# Patient Record
Sex: Female | Born: 1955 | Race: White | Hispanic: No | Marital: Married | Smoking: Never smoker
Health system: Southern US, Community
[De-identification: ages and names within clinical notes are randomized; demographics above are authoritative.]

## PROBLEM LIST (undated history)

## (undated) DIAGNOSIS — T7840XA Allergy, unspecified, initial encounter: Secondary | ICD-10-CM

## (undated) DIAGNOSIS — E559 Vitamin D deficiency, unspecified: Secondary | ICD-10-CM

## (undated) DIAGNOSIS — R202 Paresthesia of skin: Secondary | ICD-10-CM

## (undated) DIAGNOSIS — N3281 Overactive bladder: Secondary | ICD-10-CM

## (undated) DIAGNOSIS — M79602 Pain in left arm: Secondary | ICD-10-CM

## (undated) DIAGNOSIS — Z9889 Other specified postprocedural states: Secondary | ICD-10-CM

## (undated) DIAGNOSIS — R5383 Other fatigue: Secondary | ICD-10-CM

## (undated) DIAGNOSIS — R0602 Shortness of breath: Secondary | ICD-10-CM

## (undated) HISTORY — DX: Shortness of breath: R06.02

## (undated) HISTORY — PX: APPENDECTOMY: SHX54

## (undated) HISTORY — PX: ABDOMINAL HYSTERECTOMY: SHX81

## (undated) HISTORY — DX: Paresthesia of skin: R20.2

## (undated) HISTORY — DX: Pain in left arm: M79.602

## (undated) HISTORY — DX: Other specified postprocedural states: Z98.890

## (undated) HISTORY — DX: Allergy, unspecified, initial encounter: T78.40XA

## (undated) HISTORY — DX: Other fatigue: R53.83

## (undated) HISTORY — PX: URETHRAL STRICTURE DILATATION: SHX477

---

## 1999-01-10 ENCOUNTER — Other Ambulatory Visit: Admission: RE | Admit: 1999-01-10 | Discharge: 1999-01-10 | Payer: Self-pay | Admitting: Obstetrics & Gynecology

## 1999-12-18 ENCOUNTER — Encounter: Admission: RE | Admit: 1999-12-18 | Discharge: 1999-12-18 | Payer: Self-pay | Admitting: Obstetrics & Gynecology

## 1999-12-18 ENCOUNTER — Encounter: Payer: Self-pay | Admitting: Obstetrics & Gynecology

## 2000-10-08 ENCOUNTER — Ambulatory Visit (HOSPITAL_COMMUNITY): Admission: RE | Admit: 2000-10-08 | Discharge: 2000-10-08 | Payer: Self-pay | Admitting: Obstetrics & Gynecology

## 2000-10-08 ENCOUNTER — Encounter (INDEPENDENT_AMBULATORY_CARE_PROVIDER_SITE_OTHER): Payer: Self-pay

## 2001-01-05 ENCOUNTER — Encounter: Payer: Self-pay | Admitting: Obstetrics & Gynecology

## 2001-01-05 ENCOUNTER — Encounter: Admission: RE | Admit: 2001-01-05 | Discharge: 2001-01-05 | Payer: Self-pay | Admitting: Obstetrics & Gynecology

## 2001-01-12 ENCOUNTER — Other Ambulatory Visit: Admission: RE | Admit: 2001-01-12 | Discharge: 2001-01-12 | Payer: Self-pay | Admitting: Obstetrics & Gynecology

## 2002-01-09 ENCOUNTER — Encounter: Payer: Self-pay | Admitting: Obstetrics & Gynecology

## 2002-01-09 ENCOUNTER — Encounter: Admission: RE | Admit: 2002-01-09 | Discharge: 2002-01-09 | Payer: Self-pay | Admitting: Obstetrics & Gynecology

## 2002-01-17 ENCOUNTER — Other Ambulatory Visit: Admission: RE | Admit: 2002-01-17 | Discharge: 2002-01-17 | Payer: Self-pay | Admitting: Obstetrics & Gynecology

## 2002-01-31 ENCOUNTER — Encounter: Admission: RE | Admit: 2002-01-31 | Discharge: 2002-01-31 | Payer: Self-pay | Admitting: Internal Medicine

## 2002-01-31 ENCOUNTER — Encounter (HOSPITAL_BASED_OUTPATIENT_CLINIC_OR_DEPARTMENT_OTHER): Payer: Self-pay | Admitting: Internal Medicine

## 2003-01-15 ENCOUNTER — Encounter: Admission: RE | Admit: 2003-01-15 | Discharge: 2003-01-15 | Payer: Self-pay | Admitting: Obstetrics & Gynecology

## 2003-01-15 ENCOUNTER — Encounter: Payer: Self-pay | Admitting: Obstetrics & Gynecology

## 2003-01-23 ENCOUNTER — Other Ambulatory Visit: Admission: RE | Admit: 2003-01-23 | Discharge: 2003-01-23 | Payer: Self-pay | Admitting: Obstetrics & Gynecology

## 2004-01-16 ENCOUNTER — Encounter: Admission: RE | Admit: 2004-01-16 | Discharge: 2004-01-16 | Payer: Self-pay | Admitting: Obstetrics & Gynecology

## 2004-01-30 ENCOUNTER — Other Ambulatory Visit: Admission: RE | Admit: 2004-01-30 | Discharge: 2004-01-30 | Payer: Self-pay | Admitting: Obstetrics & Gynecology

## 2005-01-16 ENCOUNTER — Encounter: Admission: RE | Admit: 2005-01-16 | Discharge: 2005-01-16 | Payer: Self-pay | Admitting: Obstetrics & Gynecology

## 2005-01-28 ENCOUNTER — Encounter: Admission: RE | Admit: 2005-01-28 | Discharge: 2005-01-28 | Payer: Self-pay | Admitting: Obstetrics & Gynecology

## 2005-02-02 ENCOUNTER — Other Ambulatory Visit: Admission: RE | Admit: 2005-02-02 | Discharge: 2005-02-02 | Payer: Self-pay | Admitting: Obstetrics & Gynecology

## 2006-02-05 ENCOUNTER — Encounter: Admission: RE | Admit: 2006-02-05 | Discharge: 2006-02-05 | Payer: Self-pay | Admitting: Obstetrics & Gynecology

## 2006-07-30 ENCOUNTER — Encounter (INDEPENDENT_AMBULATORY_CARE_PROVIDER_SITE_OTHER): Payer: Self-pay | Admitting: *Deleted

## 2006-07-30 ENCOUNTER — Ambulatory Visit (HOSPITAL_BASED_OUTPATIENT_CLINIC_OR_DEPARTMENT_OTHER): Admission: RE | Admit: 2006-07-30 | Discharge: 2006-07-30 | Payer: Self-pay | Admitting: Orthopedic Surgery

## 2007-02-08 ENCOUNTER — Encounter: Admission: RE | Admit: 2007-02-08 | Discharge: 2007-02-08 | Payer: Self-pay | Admitting: Obstetrics & Gynecology

## 2007-10-07 ENCOUNTER — Emergency Department (HOSPITAL_COMMUNITY): Admission: EM | Admit: 2007-10-07 | Discharge: 2007-10-07 | Payer: Self-pay | Admitting: Emergency Medicine

## 2007-11-11 ENCOUNTER — Emergency Department (HOSPITAL_COMMUNITY): Admission: EM | Admit: 2007-11-11 | Discharge: 2007-11-11 | Payer: Self-pay | Admitting: Family Medicine

## 2008-02-13 ENCOUNTER — Encounter: Admission: RE | Admit: 2008-02-13 | Discharge: 2008-02-13 | Payer: Self-pay | Admitting: Obstetrics & Gynecology

## 2009-02-13 ENCOUNTER — Encounter: Admission: RE | Admit: 2009-02-13 | Discharge: 2009-02-13 | Payer: Self-pay | Admitting: Obstetrics & Gynecology

## 2009-04-18 ENCOUNTER — Ambulatory Visit: Payer: Self-pay | Admitting: Family Medicine

## 2009-04-18 DIAGNOSIS — R5381 Other malaise: Secondary | ICD-10-CM | POA: Insufficient documentation

## 2009-04-18 DIAGNOSIS — R5383 Other fatigue: Secondary | ICD-10-CM

## 2009-04-18 DIAGNOSIS — M949 Disorder of cartilage, unspecified: Secondary | ICD-10-CM

## 2009-04-18 DIAGNOSIS — J309 Allergic rhinitis, unspecified: Secondary | ICD-10-CM | POA: Insufficient documentation

## 2009-04-18 DIAGNOSIS — M899 Disorder of bone, unspecified: Secondary | ICD-10-CM | POA: Insufficient documentation

## 2009-04-18 DIAGNOSIS — J45909 Unspecified asthma, uncomplicated: Secondary | ICD-10-CM | POA: Insufficient documentation

## 2009-04-19 LAB — CONVERTED CEMR LAB
ALT: 20 units/L (ref 0–35)
Alkaline Phosphatase: 63 units/L (ref 39–117)
Basophils Relative: 0.5 % (ref 0.0–3.0)
Bilirubin, Direct: 0.1 mg/dL (ref 0.0–0.3)
Chloride: 109 meq/L (ref 96–112)
Creatinine, Ser: 0.7 mg/dL (ref 0.4–1.2)
Folate: 7.6 ng/mL
HCT: 34.7 % — ABNORMAL LOW (ref 36.0–46.0)
Hemoglobin: 12.4 g/dL (ref 12.0–15.0)
LDL Cholesterol: 99 mg/dL (ref 0–99)
Lymphocytes Relative: 37.3 % (ref 12.0–46.0)
MCHC: 35.6 g/dL (ref 30.0–36.0)
Monocytes Relative: 5.6 % (ref 3.0–12.0)
Neutro Abs: 3.6 10*3/uL (ref 1.4–7.7)
RBC: 3.9 M/uL (ref 3.87–5.11)
Sodium: 143 meq/L (ref 135–145)
TSH: 0.86 microintl units/mL (ref 0.35–5.50)
Total Bilirubin: 0.9 mg/dL (ref 0.3–1.2)
Total CHOL/HDL Ratio: 3
Triglycerides: 80 mg/dL (ref 0.0–149.0)

## 2009-06-04 ENCOUNTER — Encounter: Payer: Self-pay | Admitting: Family Medicine

## 2009-06-04 ENCOUNTER — Ambulatory Visit: Payer: Self-pay | Admitting: Internal Medicine

## 2009-06-05 ENCOUNTER — Ambulatory Visit: Payer: Self-pay | Admitting: Family Medicine

## 2009-06-05 DIAGNOSIS — S139XXA Sprain of joints and ligaments of unspecified parts of neck, initial encounter: Secondary | ICD-10-CM | POA: Insufficient documentation

## 2009-06-05 DIAGNOSIS — M546 Pain in thoracic spine: Secondary | ICD-10-CM | POA: Insufficient documentation

## 2009-06-11 ENCOUNTER — Telehealth: Payer: Self-pay | Admitting: Family Medicine

## 2009-06-12 ENCOUNTER — Ambulatory Visit: Payer: Self-pay | Admitting: Family Medicine

## 2009-07-17 ENCOUNTER — Ambulatory Visit: Payer: Self-pay | Admitting: Family Medicine

## 2009-07-29 LAB — CONVERTED CEMR LAB: Vit D, 25-Hydroxy: 50 ng/mL (ref 30–89)

## 2009-10-14 ENCOUNTER — Telehealth: Payer: Self-pay | Admitting: Family Medicine

## 2009-12-06 ENCOUNTER — Encounter: Payer: Self-pay | Admitting: Family Medicine

## 2010-02-14 ENCOUNTER — Encounter: Admission: RE | Admit: 2010-02-14 | Discharge: 2010-02-14 | Payer: Self-pay | Admitting: Obstetrics & Gynecology

## 2010-02-17 LAB — HM MAMMOGRAPHY: HM Mammogram: NORMAL

## 2010-02-27 LAB — CONVERTED CEMR LAB

## 2010-02-27 LAB — HM PAP SMEAR

## 2010-03-14 ENCOUNTER — Ambulatory Visit: Payer: Self-pay | Admitting: Family Medicine

## 2010-03-14 DIAGNOSIS — E041 Nontoxic single thyroid nodule: Secondary | ICD-10-CM

## 2010-03-18 LAB — CONVERTED CEMR LAB
T3, Free: 3 pg/mL (ref 2.3–4.2)
TSH: 1.1 microintl units/mL (ref 0.35–5.50)

## 2010-03-21 ENCOUNTER — Ambulatory Visit: Payer: Self-pay | Admitting: Family Medicine

## 2010-03-24 LAB — CONVERTED CEMR LAB
AST: 22 units/L (ref 0–37)
BUN: 12 mg/dL (ref 6–23)
Basophils Absolute: 0 10*3/uL (ref 0.0–0.1)
Eosinophils Absolute: 0.1 10*3/uL (ref 0.0–0.7)
GFR calc non Af Amer: 96.3 mL/min (ref >60)
Lymphocytes Relative: 38.7 % (ref 12.0–46.0)
MCHC: 33.8 g/dL (ref 30.0–36.0)
Neutrophils Relative %: 55.4 % (ref 43.0–77.0)
Platelets: 295 10*3/uL (ref 150.0–400.0)
Potassium: 4.3 meq/L (ref 3.5–5.1)
RDW: 12.8 % (ref 11.5–14.6)
Sodium: 143 meq/L (ref 135–145)
Total Bilirubin: 0.5 mg/dL (ref 0.3–1.2)
Vit D, 25-Hydroxy: 38 ng/mL (ref 30–89)

## 2010-04-07 ENCOUNTER — Encounter: Payer: Self-pay | Admitting: Family Medicine

## 2010-04-14 ENCOUNTER — Telehealth: Payer: Self-pay | Admitting: Family Medicine

## 2010-06-17 ENCOUNTER — Ambulatory Visit: Payer: Self-pay | Admitting: Internal Medicine

## 2010-06-17 DIAGNOSIS — H109 Unspecified conjunctivitis: Secondary | ICD-10-CM | POA: Insufficient documentation

## 2010-10-05 ENCOUNTER — Encounter: Payer: Self-pay | Admitting: Obstetrics & Gynecology

## 2010-10-14 NOTE — Assessment & Plan Note (Signed)
Summary: CHECK THYROID/CLE   Vital Signs:  Patient profile:   55 year old female Height:      65 inches Weight:      144.0 pounds BMI:     24.05 Temp:     99.2 degrees F oral Pulse rate:   68 / minute Pulse rhythm:   regular BP sitting:   110 / 70  (left arm) Cuff size:   regular  Vitals Entered By: Benny Lennert CMA Duncan Dull) (March 14, 2010 8:36 AM)  History of Present Illness: Chief complaint check thyriofd.   Feels neck swollen appearing.  Skin very dry, hair diffusely falling out. Increase in fatigue...over last 6 months. Cannot lose weight. Occ dizziness.   Works as Midwife. Last 11/2008.Marland Kitchenwhen carotid doppler done by her coworkers thyroid lesion on left seen .Marland Kitchen...benign in appearance. .biopsied by Dr. Wyn Quaker. Since then a work having every 6 months...last one showed ? new lesions on thyroid. Hears heart beat in ears.  Problems Prior to Update: 1)  Uri  (ICD-465.9) 2)  Back Pain  (ICD-724.5) 3)  Back Pain, Thoracic Region  (ICD-724.1) 4)  Cervical Strain, Acute  (ICD-847.0) 5)  Neck Mass  (ICD-784.2) 6)  Screening, Colon Cancer  (ICD-V76.51) 7)  Fatigue  (ICD-780.79) 8)  Screening For Lipoid Disorders  (ICD-V77.91) 9)  Osteopenia  (ICD-733.90) 10)  Allergic Rhinitis  (ICD-477.9) 11)  Asthma, Intermittent, Mild  (ICD-493.90)  Current Medications (verified): 1)  Estradiol 2 Mg Tabs (Estradiol) .... Take 1 Tablet By Mouth Once A Day 2)  Otc Sinus Meds .... As Needed 3)  Caltrate 600+d Plus 600-400 Mg-Unit Tabs (Calcium Carbonate-Vit D-Min) .... Take 1 Tablet By Mouth Every 12 Hours 4)  Vitamin D (Ergocalciferol) 50000 Unit Caps (Ergocalciferol) .Marland Kitchen.. 1 Tab By Mouth Weekly X 12 Weeks 5)  B-12 Vitamin 6)  Dha 450 Mg .... Take One Tablet Daily 7)  Vagifem 10 Mcg Tabs (Estradiol) .... Use As Directed By Gyn  Allergies: 1)  ! Penicillin 2)  ! Erythromycin 3)  ! Clindamycin 4)  ! Codeine 5)  ! * Bananas 6)  ! * Bee Stings  Past History:  Past medical,  surgical, family and social histories (including risk factors) reviewed, and no changes noted (except as noted below).  Past Medical History: Reviewed history from 04/18/2009 and no changes required. Allergic rhinitis   GYN: Dr. Jennette Kettle  Past Surgical History: Reviewed history from 04/18/2009 and no changes required. 12/2008 nonhemmorhagic solid mass, left neck: followed by Dr. Willeen Cass, ENT bx noncancerous Plans to repeat US...10 /2010 as a child ? urethral stretch for frequent UTI.  Appendectomy Hysterectomy, partial initialy, then BSO later: endometriosis, cysts and fibroids: no cancer seen...has cervix  Family History: Reviewed history from 04/18/2009 and no changes required. father: died age 19 for bladder cancer mother:COPD, arrythmia siblings: brother: CAD, heart issues...recovering crack addict, bipolar sister: allergies PGM: kidney issues  Social History: Reviewed history from 04/18/2009 and no changes required. Occupation:Vascular Lab Coordinator, in CNA school Married no children Never Smoked Alcohol use-1-2 times a week Drug use-no Regular exercise-no Diet: moderate, some fruit and veggies, chicken, fish, limited fried foods, limited calcium intake  Review of Systems General:  Complains of fatigue; denies fever and weight loss. CV:  Denies chest pain or discomfort. Resp:  Denies shortness of breath, sputum productive, and wheezing. GI:  Denies abdominal pain. GU:  Denies dysuria.  Physical Exam  General:  Well-developed,well-nourished,in no acute distress; alert,appropriate and cooperative throughout examination Ears:  External ear  exam shows no significant lesions or deformities.  Otoscopic examination reveals clear canals, tympanic membranes are intact bilaterally without bulging, retraction, inflammation or discharge. Hearing is grossly normal bilaterally. Nose:  External nasal examination shows no deformity or inflammation. Nasal mucosa are pink and moist  without lesions or exudates. Mouth:  Oral mucosa and oropharynx without lesions or exudates.  Teeth in good repair. Neck:  diffusely enlarged thyroid.Marland Kitchenno palpable masses Lungs:  Normal respiratory effort, chest expands symmetrically. Lungs are clear to auscultation, no crackles or wheezes. Heart:  Normal rate and regular rhythm. S1 and S2 normal without gallop, murmur, click, rub or other extra sounds. Pulses:  R and L posterior tibial pulses are full and equal bilaterally  Extremities:  no edema   Impression & Recommendations:  Problem # 1:  FATIGUE (ICD-780.79) Eval for developing  thyroid issues. Previous fatigue eval neg.  Orders: TLB-TSH (Thyroid Stimulating Hormone) (84443-TSH) TLB-T3, Free (Triiodothyronine) (84481-T3FREE) TLB-T4 (Thyrox), Free [phone])  Problem # 2:  THYROID NODULE (ICD-241.0) Per pt new appearance of nodules on right?  Get [place] report. MAy need referral to endo.   Orders: TLB-TSH (Thyroid Stimulating Hormone) (84443-TSH)  Complete Medication List: 1)  Estradiol 2 Mg Tabs (Estradiol) .... Take 1 tablet by mouth once a day 2)  Otc Sinus Meds  .... As needed 3)  Caltrate 600+d Plus 600-400 Mg-unit Tabs (Calcium carbonate-vit d-min) .... Take 1 tablet by mouth every 12 hours 4)  Vitamin D (ergocalciferol) 50000 Unit Caps (Ergocalciferol) .Marland Kitchen.. 1 tab by mouth weekly x 12 weeks 5)  B-12 Vitamin  6)  Dha 450 Mg  .... Take one tablet daily 7)  Vagifem 10 Mcg Tabs (Estradiol) .... Use as directed by gyn  Patient Instructions: 1)  Please send thyroid US results to our office. 2)  Call when ready to schedule colonoscopy.  3)  Fasting lipids, CMET after 04/2010 Dx Dx 77.91  Current Allergies (reviewed today): ! PENICILLIN ! ERYTHROMYCIN ! CLINDAMYCIN ! CODEINE ! * BANANAS ! * BEE STINGS  Last PAP:  normal (02/12/2009 8:42:50 AM) PAP Result Date:  02/27/2010 PAP Result:  low estrogen per Dr. Jennette Kettle PAP Next Due:  1 yr Last Mammogram:  normal (02/12/2009  8:43:20 AM) Mammogram Result Date:  02/17/2010 Mammogram Result:  normal Mammogram Next Due:  1 yr one density.Marland Kitchenosteopenia improving 02/2010

## 2010-10-14 NOTE — Progress Notes (Signed)
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 It is the only med that has ever helped her neck pain. Pt uses [facility].  Pt knows Dr. Ermalene Searing will be back tomorrow. Please advise.  Initial call taken by: Lewanda Rife LPN,  October 14, 2009 4:51 PM  Follow-up for Phone Call        Phone Call Completed patient advised.Consuello Masse CMA  Follow-up by: Benny Lennert CMA (AAMA),  October 15, 2009 10:13 AM    New/Updated Medications: CELEBREX 200 MG CAPS (CELECOXIB) 1 tab by mouth daily Prescriptions: CELEBREX 200 MG CAPS (CELECOXIB) 1 tab by mouth daily  #30 x 3   Entered and Authorized by:   Theresa Nora MD   Signed by:   Theresa Nora MD on 10/15/2009   Method used:   Electronically to        [facility] [address] [phone]* (retail)       [address] [place]  [address]       Ph: [phone]       Fax: [phone]   RxID:   [id number]

## 2010-10-14 NOTE — Consult Note (Signed)
Summary: [facility] Endocrinology  [facility] Endocrinology   Imported By: Lanelle Bal 04/14/2010 10:30:37  _____________________________________________________________________  External Attachment:    Type:   Image     Comment:   External Document

## 2010-10-14 NOTE — Assessment & Plan Note (Signed)
Summary: ??lyme disease   Vital Signs:  Patient profile:   55 year old female Weight:      149 pounds Temp:     98.2 degrees F oral Pulse rate:   68 / minute Pulse rhythm:   regular BP sitting:   108 / 70  (left arm) Cuff size:   regular  Vitals Entered By: Selena Batten Dance CMA Duncan Dull) (June 17, 2010 3:55 PM) CC: ? Lymes Disease   History of Present Illness: ZO:XWRU?  works at [employer] and [employer].  04/2010 bit by tick, PA there looked at it, pt had target lesion around bite and had eye sensitivity to light.  Given 2 wks of doxycycline for presumed lyme disease and sxs went away.    Sxs reappeared 2 days ago.  Eye pain - L > R, tearing easily, + draining.  + mild matting in AM L>R.  + photophobia.  L eye more red than right eye.  + HA  h/o allergies, recently colored hair sunday, sxs started afterwards.  had exposure to passive smoke all weekend  No recent travel outside of state.  No joint pains, muscle pains, fevers, abd pain, confusion.  no new rashes/tick bites  uses contacts.  Allergies: 1)  ! Penicillin 2)  ! Erythromycin 3)  ! Clindamycin 4)  ! Codeine 5)  ! * Bananas 6)  ! * Bee Stings  Past History:  Past Medical History: Last updated: 04/18/2009 Allergic rhinitis   GYN: Dr. Jennette Kettle  Social History: Last updated: 04/18/2009 Occupation:Vascular Lab Coordinator, in CNA school Married no children Never Smoked Alcohol use-1-2 times a week Drug use-no Regular exercise-no Diet: moderate, some fruit and veggies, chicken, fish, limited fried foods, limited calcium intake  Review of Systems       per HPI  Physical Exam  General:  Well-developed,well-nourished,in no acute distress; alert,appropriate and cooperative throughout examination Head:  Normocephalic and atraumatic without obvious abnormalities. No apparent alopecia or balding. Eyes:  + L > R conjunctival injection with limbic sparing.  EOMI wihtout pain, PERRL, slight swelling superior L  eyelid Ears:  External ear exam shows no significant lesions or deformities.  Otoscopic examination reveals clear canals, tympanic membranes are intact bilaterally without bulging, retraction, inflammation or discharge. Hearing is grossly normal bilaterally. Nose:  External nasal examination shows no deformity or inflammation. Nasal mucosa are pink and moist without lesions or exudates. Mouth:  Oral mucosa and oropharynx without lesions or exudates.  Teeth in good repair. Lungs:  Normal respiratory effort, chest expands symmetrically. Lungs are clear to auscultation, no crackles or wheezes. Heart:  Normal rate and regular rhythm. S1 and S2 normal without gallop, murmur, click, rub or other extra sounds.   Impression & Recommendations:  Problem # 1:  UNSPECIFIED CONJUNCTIVITIS (ICD-372.30)  ? irritant conjunctivitis,  recommend stop contacts for 1 1/2 wks.  treat with refresh tears and patanol eye drops. RTC if not improving, or call eye doctor to eval.  Complete Medication List: 1)  Estradiol 2 Mg Tabs (Estradiol) .... Take 1 tablet by mouth once a day 2)  Otc Sinus Meds  .... As needed 3)  Caltrate 600+d Plus 600-400 Mg-unit Tabs (Calcium carbonate-vit d-min) .... Take 1 tablet by mouth every 12 hours 4)  Vitamin D (ergocalciferol) 50000 Unit Caps (Ergocalciferol) .Marland Kitchen.. 1 tab by mouth weekly x 12 weeks 5)  B-12 Vitamin  6)  Dha 450 Mg  .... Take one tablet daily 7)  Vagifem 10 Mcg Tabs (Estradiol) .... Use  as directed by gyn 8)  Epipen 2-pak 0.3 Mg/0.25ml Devi (Epinephrine) .... Use as directed for ananphylaxis 9)  Patanol 0.1 % Soln (Olopatadine hcl) .... One drop into each eye twice daily  Patient Instructions: 1)  This could be irritant conjunctivitis or irritation to eye.  Treat with patanol twice daily for 1 week.  if not noticing improvement with this, please return or call Dr. Hyacinth Meeker to be checked out. 2)  warm compresses to eye twice daily. Prescriptions: PATANOL 0.1 % SOLN  (OLOPATADINE HCL) one drop into each eye twice daily  #1 x 0   Entered and Authorized by:   Eustaquio Boyden  MD   Signed by:   Eustaquio Boyden  MD on 06/17/2010   Method used:   Print then Give to Patient   RxID:   [id number]   Current Allergies (reviewed today): ! PENICILLIN ! ERYTHROMYCIN ! CLINDAMYCIN ! CODEINE ! * BANANAS ! * BEE STINGS

## 2010-10-14 NOTE — Progress Notes (Signed)
Summary: asking foe epi pen   Phone Note Call from Patient Call back at Home Phone [phone] Call back at [phone]   Caller: Patient Call For: Kerby Nora MD Summary of Call: Patient says that she is allergic to bees and her epipen is out of date. She is asking if she could get a rx sent to [facility] [address] in [place].  Initial call taken by: Melody Comas,  April 14, 2010 2:47 PM    New/Updated Medications: EPIPEN 2-PAK 0.3 MG/0.3ML DEVI (EPINEPHRINE) Use as directed for ananphylaxis Prescriptions: EPIPEN 2-PAK 0.3 MG/0.3ML DEVI (EPINEPHRINE) Use as directed for ananphylaxis  #1 x 0   Entered and Authorized by:   Kerby Nora MD   Signed by:   Kerby Nora MD on 04/14/2010   Method used:   Electronically to        [facility] [address] [phone]* (retail)       [address] [place]  [address]       Ph: [phone]       Fax: [phone]   RxID:   [id number]

## 2010-10-15 ENCOUNTER — Telehealth: Payer: Self-pay | Admitting: Family Medicine

## 2010-10-17 ENCOUNTER — Telehealth: Payer: Self-pay | Admitting: Family Medicine

## 2010-10-22 NOTE — Progress Notes (Signed)
Summary: prior auth needed for celebrex  Phone Note From Pharmacy   Caller: [facility] [address] [employer] of Call: Prior Berkley Harvey is needed for celebrex, form is on your desk. Initial call taken by: Lowella Petties CMA, AAMA,  October 17, 2010 10:51 AM

## 2010-10-22 NOTE — Progress Notes (Signed)
Summary: refill request for celebrex  Phone Note Refill Request Message from:  Fax from Pharmacy  Refills Requested: Medication #1:  celebrex 200 mg   Last Refilled: 11/28/2009 Faxed request from [facility] [address], this is no longer on med list.  Initial call taken by: Lowella Petties CMA, AAMA,  October 15, 2010 12:20 PM    New/Updated Medications: CELEBREX 200 MG CAPS (CELECOXIB) Take 1 tablet by mouth once a day Prescriptions: CELEBREX 200 MG CAPS (CELECOXIB) Take 1 tablet by mouth once a day  #30 x 0   Entered and Authorized by:   Kerby Nora MD   Signed by:   Kerby Nora MD on 10/15/2010   Method used:   Electronically to        [facility] [address]* (retail)       [address]       [place], [place]  [address]       Ph: [phone]       Fax: [phone]   RxID:   [id number]

## 2010-12-09 ENCOUNTER — Other Ambulatory Visit: Payer: Self-pay | Admitting: Obstetrics & Gynecology

## 2010-12-09 DIAGNOSIS — Z1231 Encounter for screening mammogram for malignant neoplasm of breast: Secondary | ICD-10-CM

## 2011-01-13 ENCOUNTER — Encounter: Payer: Self-pay | Admitting: *Deleted

## 2011-01-14 ENCOUNTER — Encounter: Payer: Self-pay | Admitting: Family Medicine

## 2011-01-14 ENCOUNTER — Ambulatory Visit (INDEPENDENT_AMBULATORY_CARE_PROVIDER_SITE_OTHER): Payer: BC Managed Care – PPO | Admitting: Family Medicine

## 2011-01-14 DIAGNOSIS — R079 Chest pain, unspecified: Secondary | ICD-10-CM | POA: Insufficient documentation

## 2011-01-14 NOTE — Patient Instructions (Signed)
REFERRAL: GO THE THE FRONT ROOM AT THE ENTRANCE OF OUR CLINIC, NEAR CHECK IN. ASK FOR MARION. SHE WILL HELP YOU SET UP YOUR REFERRAL. DATE: TIME:  

## 2011-01-14 NOTE — Progress Notes (Signed)
55 year old female minimal cardiac risk factors, presents with a week of chest pain:  Has been having chest pain, stabbing and shooting. Left, sternal.  Will come and go, may be ten to fifteeen seconds - sometimes upwards of 5-10 mins, may be on and off in the afternoon. No regularity.  No chest pain right now.  This morning, had some pain in the epigastric region.  Left arm felt a little bit heavy, also has a headset and felt like a weight on her shoulder. Known history of neck disease.  Also has some neck pain in the past, and her left arm felt heavy. And has been really tired. Also has a history of Lyme disease.   FH: No smoker. No drugs.  Mom has LBBB and has a pacemaker. No other cardiac issues.  Not clear about grandparents.   Patient Active Problem List  Diagnoses  . THYROID NODULE  . ALLERGIC RHINITIS  . ASTHMA, INTERMITTENT, MILD  . OSTEOPENIA  . FATIGUE  . Chest pain   Past Medical History  Diagnosis Date  . Allergy    Past Surgical History  Procedure Date  . Urethral stricture dilatation     when child  . Appendectomy   . Abdominal hysterectomy     has cervix   History  Substance Use Topics  . Smoking status: Never Smoker   . Smokeless tobacco: Not on file  . Alcohol Use: No   Family History  Problem Relation Age of Onset  . COPD Mother   . Heart disease Mother     arrythmia  . Cancer Father     bladder cancer  . Allergies Sister   . Heart disease Brother   . Drug abuse Brother     recovering crack addict, bipolar  . Kidney disease Paternal Grandmother    Allergies  Allergen Reactions  . Clindamycin     REACTION: Rash, SOB  . Codeine     REACTION: N \\T \ V  . Erythromycin     REACTION: Rash, SOB  . Penicillins     REACTION: As a child   Current Outpatient Prescriptions on File Prior to Visit  Medication Sig Dispense Refill  . Calcium Carbonate-Vit D-Min (CALTRATE 600+D PLUS) 600-400 MG-UNIT per tablet Take 1 tablet by mouth 2 (two) times  daily.        . celecoxib (CELEBREX) 200 MG capsule Take 200 mg by mouth daily.        . Cyanocobalamin (VITAMIN B12 PO) Take 1 tablet by mouth daily.        Marland Kitchen EPINEPHrine (EPI-PEN) 0.3 mg/0.3 mL DEVI Inject 0.3 mg into the muscle once.        Marland Kitchen estradiol (ESTRACE) 2 MG tablet Take 2 mg by mouth daily.        . Estradiol (VAGIFEM) 10 MCG TABS Place vaginally as directed.        Marland Kitchen olopatadine (PATANOL) 0.1 % ophthalmic solution Place 1 drop into both eyes 2 (two) times daily.        . Prenat-FeBis-FePro-FA-CA-Omega (DUET DHA PO) Take 450 mg by mouth daily.        . Vitamin D, Ergocalciferol, (DRISDOL) 50000 UNITS CAPS Take 50,000 Units by mouth every 7 (seven) days.        Marland Kitchen DISCONTD: chlorpheniramine-pseudoephedrine-acetaminophen (SINUTAB) 2-30-500 MG per tablet Take 1 tablet by mouth as needed.         ROS: GEN: No acute illnesses, no fevers, chills. GI: No n/v/d, eating  normally Pulm / CV above Interactive and getting along well at home.  Otherwise, ROS is as per the HPI.  GEN: WDWN, NAD, Non-toxic, A & O x 3 HEENT: Atraumatic, Normocephalic. Neck supple. No masses, No LAD. Ears and Nose: No external deformity. CV: RRR, No M/G/R. No JVD. No thrill. No extra heart sounds. PULM: CTA B, no wheezes, crackles, rhonchi. No retractions. No resp. distress. No accessory muscle use. ABD: S, NT, ND, +BS. No rebound tenderness. No HSM.  EXTR: No c/c/e NEURO Normal gait.  PSYCH: Normally interactive. Conversant. Not depressed or anxious appearing.  Calm demeanor.   A/P: Chest pain, atypical. Minimal cardiac risk factors, chest pain with abnormal EKG.  EKG: Normal sinus rhythm. Normal axis, normal R wave progression, No acute ST elevation or depression. Several leads with non-specific T wave changes.  In a 55 yo, could be an ischemic event. We have asked [facility] to assist and consult. Appreciate assistance.

## 2011-01-21 ENCOUNTER — Ambulatory Visit (INDEPENDENT_AMBULATORY_CARE_PROVIDER_SITE_OTHER): Payer: BC Managed Care – PPO | Admitting: Cardiology

## 2011-01-21 ENCOUNTER — Encounter: Payer: Self-pay | Admitting: Cardiology

## 2011-01-21 DIAGNOSIS — R5383 Other fatigue: Secondary | ICD-10-CM

## 2011-01-21 DIAGNOSIS — R202 Paresthesia of skin: Secondary | ICD-10-CM | POA: Insufficient documentation

## 2011-01-21 DIAGNOSIS — R079 Chest pain, unspecified: Secondary | ICD-10-CM

## 2011-01-21 DIAGNOSIS — M79602 Pain in left arm: Secondary | ICD-10-CM | POA: Insufficient documentation

## 2011-01-21 DIAGNOSIS — R5381 Other malaise: Secondary | ICD-10-CM

## 2011-01-21 DIAGNOSIS — R0602 Shortness of breath: Secondary | ICD-10-CM | POA: Insufficient documentation

## 2011-01-21 NOTE — Progress Notes (Signed)
Kathe Becton Date of Birth: 1956/04/06   History of Present Illness: Dreana is seen at the request of Dr. Dallas Schimke for evaluation of chest pain. She states she has been experiencing symptoms of chest pain off and on. She describes this as a stabbing sensation on both sides of her left breast. On one occasion she also experienced some left arm pain described as a heaviness. She has had some tingling in her left hand fingers. Her major complaint is that she has been severely fatigued. She has no known history of cardiac disease. She has no significant cardiac risk factors.  Current Outpatient Prescriptions on File Prior to Visit  Medication Sig Dispense Refill  . aspirin 81 MG tablet Take 81 mg by mouth daily.        Marland Kitchen EPINEPHrine (EPI-PEN) 0.3 mg/0.3 mL DEVI Inject 0.3 mg into the muscle as needed.       Marland Kitchen estradiol (ESTRACE) 2 MG tablet Take 2 mg by mouth daily.        Marland Kitchen DISCONTD: Calcium Carbonate-Vit D-Min (CALTRATE 600+D PLUS) 600-400 MG-UNIT per tablet Take 1 tablet by mouth 2 (two) times daily.        Marland Kitchen DISCONTD: celecoxib (CELEBREX) 200 MG capsule Take 200 mg by mouth daily.        Marland Kitchen DISCONTD: Cyanocobalamin (VITAMIN B12 PO) Take 1 tablet by mouth daily.        Marland Kitchen DISCONTD: Estradiol (VAGIFEM) 10 MCG TABS Place vaginally as directed.        Marland Kitchen DISCONTD: olopatadine (PATANOL) 0.1 % ophthalmic solution Place 1 drop into both eyes 2 (two) times daily.       Marland Kitchen DISCONTD: Prenat-FeBis-FePro-FA-CA-Omega (DUET DHA PO) Take 450 mg by mouth daily.        Marland Kitchen DISCONTD: Vitamin D, Ergocalciferol, (DRISDOL) 50000 UNITS CAPS Take 50,000 Units by mouth every 7 (seven) days.          Allergies  Allergen Reactions  . Clindamycin     REACTION: Rash, SOB  . Codeine     REACTION: N \\T \ V  . Erythromycin     REACTION: Rash, SOB  . Other     Bee stings, banana  . Penicillins     REACTION: As a child    Past Medical History  Diagnosis Date  . Allergy   . Asthma   . Chest pain   . Fatigue     . Arm pain, left   . Tingling     in fingers  . SOB (shortness of breath)     occassionally    Past Surgical History  Procedure Date  . Urethral stricture dilatation     when child  . Appendectomy   . Abdominal hysterectomy     has cervix    History  Smoking status  . Never Smoker   Smokeless tobacco  . Not on file    History  Alcohol Use No    Family History  Problem Relation Age of Onset  . COPD Mother   . Heart disease Mother     arrythmia  . Colon cancer Mother   . Cancer Father     bladder cancer  . Allergies Sister   . Heart disease Brother   . Drug abuse Brother     recovering crack addict, bipolar  . Kidney disease Paternal Grandmother   . Kidney disease Maternal Grandmother   . Heart disease Paternal Grandfather   . Alcohol abuse Paternal Grandfather  Review of Systems: The review of systems is positive for marked fatigue. She apparently was diagnosed with mild disease last year and was treated. She is currently working 2 jobs but doesn't find this particularly stressful. All other systems were reviewed and are negative.  Physical Exam: BP 110/80  Pulse 60  Ht 5\' 5"  (1.651 m)  Wt 144 lb 9.6 oz (65.59 kg)  BMI 24.06 kg/m2 She is a pleasant white female in no acute distress. Her she is normocephalic, atraumatic. Pupils are equal round and reactive to light and accommodation. Extraocular movements are full. Oropharynx is clear. Neck is supple no JVD, adenopathy, thyromegaly, or bruits. Lungs are clear to auscultation and percussion. Cardiac exam reveals a regular rate and rhythm without gallop, murmur, or click. Abdomen is soft and nontender. She has no masses or bruits. Femoral and pedal pulses are 2+ and symmetric. She has no edema. Skin is warm and dry. She is alert and oriented x3. Cranial nerves II through XII are intact. LABORATORY DATA: ECG demonstrates normal sinus rhythm there is T-wave inversion in leads V1 through V3.  Assessment /  Plan:

## 2011-01-21 NOTE — Assessment & Plan Note (Signed)
Her symptoms of chest pain are atypical. She has no significant cardiac risk factors. She does have mild T-wave inversion on her ECG. I recommended further cardiac evaluation with a stress echo. If this is normal we can reassure her from a cardiac standpoint.

## 2011-01-21 NOTE — Patient Instructions (Signed)
We will schedule you for a stress Echo.  If this is OK this would reassure [place] that your heart is good.

## 2011-01-21 NOTE — Assessment & Plan Note (Signed)
We will proceed with her stress test evaluation. If this is unremarkable she may need more complete laboratory evaluation to rule out anemia or thyroid disease.

## 2011-01-22 ENCOUNTER — Telehealth: Payer: Self-pay | Admitting: Cardiology

## 2011-01-22 NOTE — Telephone Encounter (Signed)
Called wanting EKG faxed to her. Faxed.

## 2011-01-22 NOTE — Telephone Encounter (Signed)
Wants a copy of ekg- phone [phone] ext [phone]   Fax [phone]

## 2011-01-30 ENCOUNTER — Ambulatory Visit (HOSPITAL_BASED_OUTPATIENT_CLINIC_OR_DEPARTMENT_OTHER): Payer: BC Managed Care – PPO | Admitting: Radiology

## 2011-01-30 ENCOUNTER — Ambulatory Visit (HOSPITAL_COMMUNITY): Payer: BC Managed Care – PPO | Attending: Cardiology | Admitting: Radiology

## 2011-01-30 ENCOUNTER — Other Ambulatory Visit (HOSPITAL_COMMUNITY): Payer: BC Managed Care – PPO | Admitting: Radiology

## 2011-01-30 DIAGNOSIS — R072 Precordial pain: Secondary | ICD-10-CM

## 2011-01-30 DIAGNOSIS — R0989 Other specified symptoms and signs involving the circulatory and respiratory systems: Secondary | ICD-10-CM | POA: Insufficient documentation

## 2011-01-30 DIAGNOSIS — R5381 Other malaise: Secondary | ICD-10-CM | POA: Insufficient documentation

## 2011-01-30 DIAGNOSIS — R0609 Other forms of dyspnea: Secondary | ICD-10-CM | POA: Insufficient documentation

## 2011-01-30 NOTE — Op Note (Signed)
[facility]  Patient:    Theresa Stokes, Theresa Stokes                      MRN: [id number] Proc. Date: 10/08/00 Adm. 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  perimenopausal like symptoms.  Request by patient for definitive surgical interventions, specifically bilateral salpingo-oophorectomy.  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  Perimenopausal like symptoms.  Request by patient for definitive surgical interventions, specifically bilateral salpingo-oophorectomy.  Evidence of recurrent endometriosis.  OPERATIVE PROCEDURE:          Laparoscopic bilateral salpingo-oophorectomy and fulguration of pelvic endometriosis.  SURGEON:                      Freddy Finner, M.D.  ANESTHESIA:                   General endotracheal.  INTRAOPERATIVE COMPLICATIONS:                None.  ESTIMATED INTRAOPERATIVE BLOOD LOSS:                   20 cc or less.  INDICATIONS:                  The patient is a 55 year old who describes approximately 6-12 months history of bilateral lower quadrant abdominal and pelvic pain which she believes is related to her ovaries.  She also has noted the onset of some regular hot flashes which she associates with menopausal like change.  She is known to have endometriosis diagnosed at the time of a total abdominal hysterectomy in 1993.  Hysterectomy was done for fibroids. She has requested definitive surgical intervention and she is admitted at this time for that purpose.  INTRAOPERATIVE FINDINGS:      Intraoperative findings included some scattered endometriotic lesions near the cul-de-sac, vaginal cuff, apex.  There was a lesion consistent with endometriosis on the right ovary.  Some minimal adhesions were noted from the left ovary to the  left pelvic sidewall. Photographs were made of the intraoperative findings and retained in the office record.  DESCRIPTION OF PROCEDURE:     The patient was admitted on the morning of surgery and brought to the operating room, there placed under adequate general endotracheal anesthesia, placed in the dorsal lithotomy using the [facility] stirrup system.  Betadine prep of the abdomen was carried out in the usual fashion.  The bladder was evacuated with a Robinson catheter using sterile technique.  Sponges were folded and placed in the jaws of the sponge forceps, which was placed into the vagina for identification purposes during the procedure.  Sterile drapes were applied.  Two small incisions were made, one at the umbilicus, one just above the symphysis.  Through the upper incision, the 12 mm disposable trocar was introduced.  Inspection revealed adequate placement with no evidence of injury on entry.  The peritoneum was allowed to accumulate with carbon dioxide gas.  Careful systematic examination of the pelvic and abdominal contents was carried out. The appendix was not visualized.  There was no apparent abnormality in the upper abdomen.  There  was some scattered powder burn like lesions in the pelvis and some scarred areas, particularly near the area of the left uterosacral ligament which were consist with endometriosis and these were fulgurated with bipolar forceps.  The infundibulopelvic ligaments on each side were developed after grasping and elevating the ovary with an atraumatic grasping forceps.  The bites were taken progressively until the ovary and tube were completely separated.  Each side was treated identically.  The ovary was morcellated on the right side by a single incision through the midportion of the ovary and after both removed, both were easily removed from the abdomen through the upper trocar port by using a toothed biopsy forceps to grasp the ovary and retrieve it  through the trocar sleeve.  At completion of the procedure was also associated with complete hemostasis.  Gas was allowed to escape from the abdomen and this was confirmed after reduced intra-abdominal pressure.  All instruments were then removed.  At total of 10 cc of 0.25% Marcaine was used to inject the incision sites for postoperative analgesic. Skin incisions were closed with interrupted subcuticular sutures of 3-0 Dexon. Steri-Strips were applied to the lower incision.  The patient tolerated the procedure well and was returned to recovery in good condition, and will be discharged in the immediate postoperative period for followup in the office in two weeks.  She has started hormone replacement therapy, specifically CombiPatch 50/140. She has Darvocet which is to be used for postoperative pain.  She is to see me in two weeks. DD:  10/08/00 TD:  10/08/00 Job: [id number]

## 2011-01-30 NOTE — Op Note (Signed)
NAMEKENIYA, Theresa Stokes               ACCOUNT NO.:  [id number]   MEDICAL RECORD NO.:  [id number]          PATIENT TYPE:  AMB   LOCATION:  DSC                          FACILITY:  [facility]   PHYSICIAN:  Katy Fitch. Sypher, M.D. DATE OF BIRTH:  23-Aug-1956   DATE OF PROCEDURE:  07/30/2006  DATE OF DISCHARGE:                                 OPERATIVE REPORT   PREOPERATIVE DIAGNOSIS:  Mass right thumb pulp, rule out flexor sheath cyst  versus other soft tissue tumor.   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   OPERATIONS:  Excisional biopsy of infiltrating mass ulnar aspect of right  thumb pulp with dissection of ulnar neurovascular bundle.   SURGEON:  Katy Fitch. Sypher, M.D.   ASSISTANT:  Marveen Reeks. Dasnoit, P.A.-C.   ANESTHESIA:  2% lidocaine metacarpal head level block supplemented by IV  sedation.   SUPERVISING ANESTHESIOLOGIST:  Quita Skye. Krista Blue, M.D.   INDICATIONS:  Shelonda Saxe is a 56 year old woman who is employed as a  Nutritional therapist at [employer].  She was  referred by Dr. Dossie Arbour, her primary care physician, for evaluation of  a mass involving her right thumb pulp.  Clinical examination revealed a well  circumscribed mass adjacent to the flexor sheath consistent with possible  flexor sheath ganglion or perhaps a granuloma anulare.  She requested  excisional biopsy.  She understands we cannot offer her a prognosis until we  have a tissue diagnosis.  After informed consent, she is brought to the  operating room at this time.   PROCEDURE:  Shakthi Scipio is brought to the operating room and placed in the  supine position on the operating table.  Following the provision of light  sedation, the right arm was prepped with Betadine soap solution and  sterilely draped.  A pneumatic tourniquet was applied to the proximal right  brachium.  2% lidocaine was infiltrated at the metacarpal head level to  obtain a digital block.  When anesthesia was satisfactory, the procedure  commenced with a Brunner zigzag incision exposing the pulp ulnar to the  interphalangeal joint flexion crease.  The mass was adherent to the deep  surface of the dermis, highly vascular, and yellowish in appearance.  It was  infiltrating through the ulnar neurovascular bundle.  With great care,  utilizing iris scissors and a fine pair of forceps, the mass was dissected  between the ulnar proper digital artery and ulnar proper digital nerve and  multiple branches were spared.  A single branch to a portion of the pulp was  sacrificed with the mass as it was just severely intertwined with the mass.  The mass was cleared off the deep surface of the dermis with iris scissors.  The mass was measured approximately 8 mm in length and 6 mm in width and  height.  A small portion was also removed more distally from the flexor  sheath.  This was passed off in formalin for pathologic evaluation.  The  wound was inspected for  bleeding points and  subsequently repaired with a mattress suture of 5-0 nylon.  A compressive  dressing was applied with Xeroflow, sterile gauze, and Coban. There were no  apparent complications.  Ms. Bade tolerated the surgery and anesthesia  well.  She was transferred to the recovery room with stable vital signs.      Katy Fitch Sypher, M.D.  Electronically Signed     RVS/MEDQ  D:  07/30/2006  T:  07/30/2006  Job:  [id number]

## 2011-02-02 ENCOUNTER — Telehealth: Payer: Self-pay | Admitting: Cardiology

## 2011-02-02 NOTE — Telephone Encounter (Signed)
Lm that echo results not back yet. Will call when Dr. Swaziland has reviewed.

## 2011-02-02 NOTE — Telephone Encounter (Signed)
CALL PT ABT HER STRESS ECHO Friday/CAROLYN AND LINDA SAID WAITED LONG ENOUGH - WANTS RESULTS.

## 2011-02-03 ENCOUNTER — Telehealth: Payer: Self-pay | Admitting: *Deleted

## 2011-02-03 NOTE — Telephone Encounter (Signed)
Notified of echo results. Faxed to Dr. Patsy Lager.

## 2011-02-12 ENCOUNTER — Encounter (HOSPITAL_COMMUNITY): Payer: Self-pay | Admitting: Cardiology

## 2011-02-13 ENCOUNTER — Ambulatory Visit (INDEPENDENT_AMBULATORY_CARE_PROVIDER_SITE_OTHER): Payer: BC Managed Care – PPO | Admitting: Family Medicine

## 2011-02-13 ENCOUNTER — Encounter: Payer: Self-pay | Admitting: Family Medicine

## 2011-02-13 VITALS — BP 120/70 | HR 67 | Temp 98.0°F | Ht 65.5 in | Wt 143.8 lb

## 2011-02-13 DIAGNOSIS — Z13220 Encounter for screening for lipoid disorders: Secondary | ICD-10-CM

## 2011-02-13 DIAGNOSIS — E041 Nontoxic single thyroid nodule: Secondary | ICD-10-CM

## 2011-02-13 DIAGNOSIS — R5383 Other fatigue: Secondary | ICD-10-CM

## 2011-02-13 DIAGNOSIS — R5381 Other malaise: Secondary | ICD-10-CM

## 2011-02-13 LAB — TSH: TSH: 0.93 u[IU]/mL (ref 0.35–5.50)

## 2011-02-13 LAB — CBC WITH DIFFERENTIAL/PLATELET
Basophils Relative: 0.3 % (ref 0.0–3.0)
Eosinophils Absolute: 0.2 10*3/uL (ref 0.0–0.7)
Lymphs Abs: 2.7 10*3/uL (ref 0.7–4.0)
MCHC: 34.5 g/dL (ref 30.0–36.0)
MCV: 89.2 fl (ref 78.0–100.0)
Monocytes Absolute: 0.4 10*3/uL (ref 0.1–1.0)
Neutrophils Relative %: 64.2 % (ref 43.0–77.0)
RBC: 4.18 Mil/uL (ref 3.87–5.11)

## 2011-02-13 LAB — LIPID PANEL
HDL: 64.4 mg/dL (ref >39.00)
Total CHOL/HDL Ratio: 3
VLDL: 30.4 mg/dL (ref 0.0–40.0)

## 2011-02-13 LAB — COMPREHENSIVE METABOLIC PANEL
ALT: 14 U/L (ref 0–35)
AST: 17 U/L (ref 0–37)
Creatinine, Ser: 0.7 mg/dL (ref 0.4–1.2)
Total Bilirubin: 0.7 mg/dL (ref 0.3–1.2)

## 2011-02-13 NOTE — Patient Instructions (Signed)
We will call with lab results   

## 2011-02-13 NOTE — Assessment & Plan Note (Signed)
Recent neg cardiac work up. Will eval thyroid as well as other possible sources of fatigue.  ? Working 2 jobs and stress may be causing fatigue.

## 2011-02-13 NOTE — Progress Notes (Signed)
  Subjective:    Patient ID: Theresa Stokes, female    DOB: 27-May-1956, 55 y.o.   MRN: [id number]  HPI 55 year old female with history of left sided thyroid nodule, euthyroid. Saw ENDO in 04/2010. [place] and biopsy showed no worrisome features.  Today: She states she has become extremally tired in past few months. Occ mild shortness of breath. No cold intolerance, no constipation, no weight gain, some slowed thinking. No blood in stool, urine. No depression, but occ anxiety (controlled by alprazolam) No multivitamin.  Takes hormones...estradiol (Dr. Jennette Kettle, has appt 6/25)  Has been working two jobs lately. Not exercsiing any longer.  Has hx of chest pain/abnormal EKG  In past month... Neg stress EcHO .Marland Kitchen Low risk.  Seeing Dr. Swaziland Cardiologist.    Review of Systems  Constitutional: Negative for fever and unexpected weight change.  HENT: Negative for nosebleeds.   Eyes: Negative for redness.  Respiratory: Negative for wheezing.   Cardiovascular: Negative for palpitations and leg swelling.  Gastrointestinal: Negative for abdominal pain.  Genitourinary: Negative for hematuria.       Objective:   Physical Exam  Constitutional: Vital signs are normal. She appears well-developed and well-nourished. She is cooperative.  Non-toxic appearance. She does not appear ill. No distress.  HENT:  Head: Normocephalic.  Right Ear: Hearing, tympanic membrane, external ear and ear canal normal. Tympanic membrane is not erythematous, not retracted and not bulging.  Left Ear: Hearing, tympanic membrane, external ear and ear canal normal. Tympanic membrane is not erythematous, not retracted and not bulging.  Nose: No mucosal edema or rhinorrhea. Right sinus exhibits no maxillary sinus tenderness and no frontal sinus tenderness. Left sinus exhibits no maxillary sinus tenderness and no frontal sinus tenderness.  Mouth/Throat: Uvula is midline, oropharynx is clear and moist and mucous membranes are normal.    Eyes: Conjunctivae, EOM and lids are normal. Pupils are equal, round, and reactive to light. No foreign bodies found.  Neck: Trachea normal and normal range of motion. Neck supple. Carotid bruit is not present. Thyromegaly present. No mass present.       Unable to palpate nodule in left thyroid, but appears slightly enlarged on that side.  Cardiovascular: Normal rate, regular rhythm, S1 normal, S2 normal, normal heart sounds, intact distal pulses and normal pulses.  Exam reveals no gallop and no friction rub.   No murmur heard. Pulmonary/Chest: Effort normal and breath sounds normal. Not tachypneic. No respiratory distress. She has no decreased breath sounds. She has no wheezes. She has no rhonchi. She has no rales.  Abdominal: Soft. Normal appearance and bowel sounds are normal. There is no tenderness.  Neurological: She is alert.  Skin: Skin is warm, dry and intact. No rash noted.  Psychiatric: Her speech is normal and behavior is normal. Judgment and thought content normal. Her mood appears not anxious. Cognition and memory are normal. She does not exhibit a depressed mood.          Assessment & Plan:

## 2011-02-16 ENCOUNTER — Ambulatory Visit
Admission: RE | Admit: 2011-02-16 | Discharge: 2011-02-16 | Disposition: A | Discharge status: Home / Self Care | Payer: BC Managed Care – PPO | Source: Ambulatory Visit | Attending: Obstetrics & Gynecology | Admitting: Obstetrics & Gynecology

## 2011-02-16 DIAGNOSIS — Z1231 Encounter for screening mammogram for malignant neoplasm of breast: Secondary | ICD-10-CM

## 2011-05-16 DIAGNOSIS — Z9889 Other specified postprocedural states: Secondary | ICD-10-CM

## 2011-05-16 HISTORY — PX: CARDIAC CATHETERIZATION: SHX172

## 2011-05-16 HISTORY — DX: Other specified postprocedural states: Z98.890

## 2011-06-03 ENCOUNTER — Observation Stay (HOSPITAL_COMMUNITY): Payer: BC Managed Care – PPO

## 2011-06-03 ENCOUNTER — Observation Stay (HOSPITAL_COMMUNITY)
Admission: AD | Admit: 2011-06-03 | Discharge: 2011-06-04 | Disposition: A | Discharge status: Home / Self Care | Payer: BC Managed Care – PPO | Source: Ambulatory Visit | Attending: Internal Medicine | Admitting: Internal Medicine

## 2011-06-03 ENCOUNTER — Telehealth: Payer: Self-pay | Admitting: Cardiology

## 2011-06-03 DIAGNOSIS — R079 Chest pain, unspecified: Secondary | ICD-10-CM

## 2011-06-03 DIAGNOSIS — M79609 Pain in unspecified limb: Secondary | ICD-10-CM | POA: Insufficient documentation

## 2011-06-03 DIAGNOSIS — M549 Dorsalgia, unspecified: Secondary | ICD-10-CM | POA: Insufficient documentation

## 2011-06-03 LAB — CBC
HCT: 35 % — ABNORMAL LOW (ref 36.0–46.0)
Hemoglobin: 12.2 g/dL (ref 12.0–15.0)
MCH: 30 pg (ref 26.0–34.0)
MCHC: 34.9 g/dL (ref 30.0–36.0)
MCV: 86.2 fL (ref 78.0–100.0)
RDW: 12.4 % (ref 11.5–15.5)

## 2011-06-03 LAB — DIFFERENTIAL
Basophils Absolute: 0 10*3/uL (ref 0.0–0.1)
Eosinophils Relative: 1 % (ref 0–5)
Lymphocytes Relative: 33 % (ref 12–46)
Monocytes Absolute: 0.5 10*3/uL (ref 0.1–1.0)
Monocytes Relative: 5 % (ref 3–12)

## 2011-06-03 LAB — COMPREHENSIVE METABOLIC PANEL
Albumin: 3.5 g/dL (ref 3.5–5.2)
BUN: 8 mg/dL (ref 6–23)
Calcium: 9.5 mg/dL (ref 8.4–10.5)
Creatinine, Ser: 0.56 mg/dL (ref 0.50–1.10)
GFR calc Af Amer: >60 mL/min (ref >60)
Glucose, Bld: 94 mg/dL (ref 70–99)
Total Protein: 7 g/dL (ref 6.0–8.3)

## 2011-06-03 LAB — CARDIAC PANEL(CRET KIN+CKTOT+MB+TROPI)
Relative Index: INVALID (ref 0.0–2.5)
Total CK: 48 U/L (ref 7–177)
Troponin I: <0.3 ng/mL (ref <0.30)

## 2011-06-03 LAB — LIPASE, BLOOD: Lipase: 18 U/L (ref 11–59)

## 2011-06-03 LAB — APTT: aPTT: 33 seconds (ref 24–37)

## 2011-06-03 NOTE — Telephone Encounter (Signed)
Called stating she went to urgent care this AM with chest discomfort, pain across shoulders and arm pain. Had EKG done this AM. Faxed to our  office. Per Dr. Swaziland has more T wave changes compared to EKG done in May. Advised to go to Hospital and he will cath her tomorrow. Called Trish and will direct admit her to Dr. Johney Frame (DOD) today and then have cath tomorrow. Pt will comply and go to [facility]. She will go to [id number]. Will fax orders to Trish in cath lab at [phone].

## 2011-06-03 NOTE — Telephone Encounter (Signed)
Pt states she didn't feel well this morning.  Pt went to a walk in urgent clinic in [place] and is faxing over a copy of the EKG that was done.  Pt would like a call back to discuss medical advice based off of EKG sent to compare to previous ones done at [facility].  Please call pt back once received EKG.  Pt states will be faxing it in 10 minutes.

## 2011-06-04 LAB — BASIC METABOLIC PANEL
BUN: 11 mg/dL (ref 6–23)
Chloride: 108 mEq/L (ref 96–112)
GFR calc Af Amer: >60 mL/min (ref >60)
GFR calc non Af Amer: >60 mL/min (ref >60)
Glucose, Bld: 88 mg/dL (ref 70–99)
Potassium: 4.1 mEq/L (ref 3.5–5.1)
Sodium: 141 mEq/L (ref 135–145)

## 2011-06-04 LAB — CBC
HCT: 35.7 % — ABNORMAL LOW (ref 36.0–46.0)
Hemoglobin: 12.1 g/dL (ref 12.0–15.0)
MCHC: 33.9 g/dL (ref 30.0–36.0)
RDW: 12.7 % (ref 11.5–15.5)
WBC: 9.3 10*3/uL (ref 4.0–10.5)

## 2011-06-04 LAB — LIPID PANEL
HDL: 61 mg/dL (ref >39)
Total CHOL/HDL Ratio: 3.1 RATIO
Triglycerides: 110 mg/dL (ref <150)

## 2011-06-04 LAB — CARDIAC PANEL(CRET KIN+CKTOT+MB+TROPI)
Relative Index: INVALID (ref 0.0–2.5)
Relative Index: INVALID (ref 0.0–2.5)
Troponin I: <0.3 ng/mL (ref <0.30)

## 2011-06-08 ENCOUNTER — Encounter: Payer: Self-pay | Admitting: *Deleted

## 2011-06-09 NOTE — Cardiovascular Report (Signed)
  [name], GOODGAME NO.:  [id number]  MEDICAL RECORD NO.:  [id number]  LOCATION:  4732                         FACILITY:  [facility]  PHYSICIAN:  Peter M. Swaziland, M.D.  DATE OF BIRTH:  1956/03/30  DATE OF PROCEDURE:  06/04/2011 DATE OF DISCHARGE:  06/04/2011                           CARDIAC CATHETERIZATION   INDICATIONS FOR PROCEDURE:  A 55 year old white female who presents with symptoms of substernal chest pain, back pain, and left arm pain.  ECG showed increased T-wave inversions in the anterior leads.  She had had a prior stress echo that was negative in the past, but given her ongoing symptoms, it was felt that a definitive diagnosis was needed.  PROCEDURES:  Left heart catheterization, coronary and left ventricular angiography.  ACCESS:  Via the right radial artery using the standard Seldinger technique.  EQUIPMENT:  A 5-French 4 cm right Judkins catheter, 5-French 3.5-cm left Judkins catheter, 5-French pigtail catheter, 5-French arterial sheath.  MEDICATIONS:  Local anesthesia of 1% Xylocaine, Versed 2 mg IV, fentanyl 25 mcg IV.  CONTRAST:  80 mL of Omnipaque.  HEMODYNAMIC DATA:  Aortic pressure is 127/66 with a mean of 92 mmHg, left ventricle pressure is 142 with an EDP of 20 mmHg.  ANGIOGRAPHIC DATA:  The right coronary artery arises and distributes in a codominant fashion.  It is normal.  The left coronary artery arises normally.  The left main is essentially shared ostium from the LAD and left circumflex coronary artery.  The left anterior descending artery is normal.  The left circumflex coronary artery is a codominant vessel and is normal.  Left ventricular angiography was performed in the RAO view.  This demonstrates normal left ventricular size and contractility with normal systolic function.  Ejection fraction is estimated at 65%.  FINAL INTERPRETATION: 1. Normal coronary anatomy. 2. Normal left ventricular function.     ______________________________ Peter M. Swaziland, M.D.     PMJ/MEDQ  D:  06/04/2011  T:  06/04/2011  Job:  [id number]  cc:   Kerby Nora, MD  Electronically Signed by PETER Swaziland M.D. on 06/09/2011 01:31:56 PM

## 2011-06-11 NOTE — H&P (Addendum)
NAMEMarland Kitchen  Theresa Stokes, Theresa Stokes NO.:  [id number]  MEDICAL RECORD NO.:  [id number]  LOCATION:  4732                         FACILITY:  [facility]  PHYSICIAN:  Hillis Range, MD       DATE OF BIRTH:  08-05-1956  DATE OF ADMISSION:  06/03/2011 DATE OF DISCHARGE:                             HISTORY & PHYSICAL   PRIMARY CARDIOLOGIST:  Peter M. Swaziland, MD  PRIMARY MEDICAL DOCTOR:  Kerby Nora, MD  CHIEF COMPLAINT:  Chest pain.  HISTORY OF PRESENT ILLNESS:  Ms. Talsma is a 55 year old female with a history of asthma, but no prior history of coronary artery disease, with a negative stress echo in May 2011, who is here with chest pain and back pain.  This morning, the patient had restless sleep after her husband left for work and just did not feel quite right.  On and off all day, she had chest pain radiating to her left arm along with back pain.  She describes the chest discomfort as a sharp intermittent pain, occasionally throbbing at times, lasting minutes.  It occurs and ceases spontaneously without intervention.  The pain is occasionally associated with a back burning sensation between her shoulder blades.  Nothing seems to exacerbate her symptoms.  She does not exercise, but denies any exertional symptoms with activity such as going up stairs.  It is not positional.  She does endorse occasional mild dyspnea on exertion, but denies any nausea, vomiting, diaphoresis.  She has also been evaluated for marked fatigue with negative workup thus far.  She went to the [facility] where she works part time and requested to have an EKG done after she did not feel well this morning.  Her EKG demonstrated T-wave inversion V1 through V3, possibly increased from before and was therefore instructed to go to the hospital for further evaluation.  Lab work is pending.  PAST MEDICAL HISTORY: 1. Asthma in childhood. 2. Chest pain with negative stress echo in May 2012. 3. Left  thyroid nodule with negative biopsy and normal thyroid     functions. 4. Dizziness with negative carotid Dopplers in March 2011.  PAST SURGICAL HISTORY: 1. 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 2. Abdominal hysterectomy. 3. Appendectomy.  OUTPATIENT MEDICATIONS: 1. Estradiol 2 mg daily. 2. Aspirin 81 mg daily. 3. Vitamin D 2000 units daily. 4. Vagifem 10 mg 2-3 times per week.  ALLERGIES:  CLINDAMYCIN, CODEINE, ERYTHROMYCIN, BEE STINGS, BANANAS, and PENICILLIN.  SOCIAL HISTORY:  Lives in [place] with her husband.  They do not have any children.  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  She denies any tobacco use.  She drinks very rare alcohol.  She denies any illicit drug use.  FAMILY HISTORY:  Her mother is living at age 44 and has COPD and colon cancer and was recently diagnosed with stomach cancer as well.  She has a left bundle-branch block and a pacemaker and had a late MI which was thought due to her chemotherapy.  Father died at 29 of bladder and bone cancer.  She has one brother who has a weak heart, but states  he is a drug addict.  REVIEW OF SYSTEMS:  No fevers, chills, shortness of breath at rest.  She has mild dyspnea on exertion.  No syncope or dizziness.  No nausea, vomiting, diarrhea.  All other systems reviewed and otherwise negative.  LABORATORY DATA:  Pending.  CBC and BMET were within normal limits in June of this year.  A lipid panel in June showed an LDL of 98.  EKG:  Normal sinus rhythm with T-wave inversion V1 through V3 with biphasic T-wave in V4, perhaps more pronounced this admission, but only slightly.  RADIOLOGIC STUDIES:  None.  PHYSICAL EXAMINATION:  VITAL SIGNS:  Temperature 98.1, pulse 70, respirations 18, blood pressure 118/75, pulse ox 98% on room air. GENERAL:  This is a pleasant, well-appearing white female in no acute distress. HEENT:  Normocephalic, atraumatic.   Extraocular movements intact.  Clear sclerae.  Nares are without discharge. NECK:  Supple without carotid bruit or JVD. HEART:  Auscultation to the heart reveals regular rate and rhythm with S1, S2 without murmurs, rubs, or gallops. LUNGS:  Clear to auscultation bilaterally without wheezes, rales, or rhonchi. ABDOMEN:  Soft, nontender, nondistended with positive bowel sounds. EXTREMITIES:  Warm, dry, and without edema.  She has 2+ pedal pulses bilaterally. NEUROLOGIC:  She is alert and oriented x3, responds to questions appropriately with a normal affect.  She is slightly apprehensive appearing.  ASSESSMENT AND PLAN:  The patient was seen and examined by Dr. Johney Frame and myself.  This is a 55 year old female with no prior cardiac history, who presents with discomfort across her shoulder blades and upper back with left arm pain as well as some chest discomfort.  EKG reveals more prominent T-wave inversion V1 through V3 concerning for ischemia.  The patient is quite concerned and not reassured by prior stress echo. Although she has a very low risk factor profile, given her EKG changes and persistent discomfort, Dr. Johney Frame feels that left heart catheterization as previously tentatively planned by Dr. Swaziland is the best route to evaluate her pain.  We will admit the patient to the hospital, cycle cardiac enzymes.  She will be placed on aspirin as well as empiric statin therapy with a recheck of her fasting lipids in the morning.  Risks, benefits, and alternatives of left heart catheterization were discussed with the patient who wishes to proceed.  For now, she will be placed on DVT prophylaxis, heparin dosing, but should be changed to heparin drip if her cardiac enzymes ruled her in or if she has further pain.     Ronie Spies, P.A.C.   ______________________________ Hillis Range, MD    DD/MEDQ  D:  06/03/2011  T:  06/04/2011  Job:  [id number]  cc:   Peter M. Swaziland, M.D. Kerby Nora, MD  Electronically Signed by Ronie Spies  on 06/11/2011 07:05:58 PM Electronically Signed by Hillis Range MD on 06/18/2011 08:44:32 AM

## 2011-06-17 ENCOUNTER — Encounter: Payer: Self-pay | Admitting: Nurse Practitioner

## 2011-06-17 ENCOUNTER — Ambulatory Visit (INDEPENDENT_AMBULATORY_CARE_PROVIDER_SITE_OTHER): Payer: BC Managed Care – PPO | Admitting: Nurse Practitioner

## 2011-06-17 VITALS — BP 112/78 | HR 80 | Ht 65.0 in | Wt 145.0 lb

## 2011-06-17 DIAGNOSIS — Z9889 Other specified postprocedural states: Secondary | ICD-10-CM

## 2011-06-17 DIAGNOSIS — R079 Chest pain, unspecified: Secondary | ICD-10-CM

## 2011-06-17 NOTE — Assessment & Plan Note (Signed)
Her cath was normal. She is doing well. We will see her back on a prn basis. Encouraged her to exercise and eat right. Patient is agreeable to this plan and will call if any problems develop in the interim.

## 2011-06-17 NOTE — Patient Instructions (Signed)
Take care of yourself. Exercise and try to eat right. We will see you back as needed.   Call for any problems.

## 2011-06-17 NOTE — Progress Notes (Signed)
    Theresa Stokes Date of Birth: December 19, 1955   History of Present Illness: Theresa Stokes is seen back today for a post hospital visit. She is seen for Dr. Swaziland. She has an abnormal EKG. She was having some atypical chest pain. She had cardiac cath that was normal. She is doing well. Her chest pain was felt to be due to sudafed use and stress. She is doing well. She has no complaints. She has had no problems with her cath site (right radial).   Current Outpatient Prescriptions on File Prior to Visit  Medication Sig Dispense Refill  . aspirin 81 MG tablet Take 81 mg by mouth daily.        Marland Kitchen EPINEPHrine (EPI-PEN) 0.3 mg/0.3 mL DEVI Inject 0.3 mg into the muscle as needed.       Marland Kitchen estradiol (ESTRACE) 2 MG tablet Take 2 mg by mouth daily.          Allergies  Allergen Reactions  . Clindamycin     REACTION: Rash, SOB  . Codeine     REACTION: N \\T \ V  . Erythromycin     REACTION: Rash, SOB  . Other     Bee stings, banana  . Penicillins     REACTION: As a child    Past Medical History  Diagnosis Date  . Allergy   . Asthma   . Chest pain     s/p cath; has normal coronaries and normal EF  . Fatigue   . Arm pain, left   . Tingling     in fingers  . SOB (shortness of breath)     occassionally  . S/P cardiac cath Sept 2012    Normal    Past Surgical History  Procedure Date  . Urethral stricture dilatation     when child  . Appendectomy   . Abdominal hysterectomy     has cervix  . Cardiac catheterization Sept 2012    Normal    History  Smoking status  . Never Smoker   Smokeless tobacco  . Not on file    History  Alcohol Use No    Family History  Problem Relation Age of Onset  . COPD Mother   . Heart disease Mother     arrythmia  . Colon cancer Mother   . Cancer Father     bladder cancer  . Allergies Sister   . Heart disease Brother   . Drug abuse Brother     recovering crack addict, bipolar  . Kidney disease Paternal Grandmother   . Kidney disease  Maternal Grandmother   . Heart disease Paternal Grandfather   . Alcohol abuse Paternal Grandfather     Review of Systems: The review of systems is per the HPI.  All other systems were reviewed and are negative.  Physical Exam: BP 112/78  Pulse 80  Ht 5\' 5"  (1.651 m)  Wt 145 lb (65.772 kg)  BMI 24.13 kg/m2 Patient is very pleasant and in no acute distress. Skin is warm and dry. Color is normal.  HEENT is unremarkable. Normocephalic/atraumatic. PERRL. Sclera are nonicteric. Neck is supple. No masses. No JVD. Lungs are clear. Cardiac exam shows a regular rate and rhythm. Abdomen is soft. Extremities are without edema. Right radial pulse is 2+. Very small knot noted over cath site but overall is ok. Gait and ROM are intact. No gross neurologic deficits noted.   LABORATORY DATA:   Assessment / Plan:

## 2011-06-23 NOTE — Discharge Summary (Signed)
  [name], Theresa Stokes NO.:  [id number]  MEDICAL RECORD NO.:  [id number]  LOCATION:  4732                         FACILITY:  [facility]  PHYSICIAN:  Peter M. Swaziland, M.D.  DATE OF BIRTH:  12-25-1955  DATE OF ADMISSION:  06/03/2011 DATE OF DISCHARGE:  06/04/2011                              DISCHARGE SUMMARY   PROCEDURES: 1. Cardiac catheterization. 2. Coronary arteriogram. 3. Left ventriculogram. 4. Portable chest x-ray.  PRIMARY FINAL DISCHARGE DIAGNOSIS:  Chest pain, normal coronary arteries at cath with a codominant system, normal left ventricle.  SECONDARY DIAGNOSES: 1. Childhood asthma. 2. History of left thyroid nodule with negative biopsy and normal     thyroid function. 3. History of dizziness with negative carotid Dopplers in March of     2011. 4. 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 5. Status post abdominal hysterectomy and appendectomy. 6. Allergy or intolerance to CLINDAMYCIN, CODEINE, ERYTHROMYCIN, BEE     STING, BANANAS, and PENICILLIN.  TIME OF DISCHARGE:  Thirty-two minutes.  HOSPITAL COURSE:  [name] is a 55 year old female with no previous history of coronary artery disease.  She had chest pain and came to the hospital where she was admitted for further evaluation and treatment.  Her cardiac enzymes were negative for MI.  On a lipid profile, her HDL was 61 with an LDL of 409.  A chest x-ray showed no acute abnormalities. Her symptoms were concerning, so cardiac catheterization was performed on June 04, 2011.  She had a codominant system with normal coronaries and normal left ventricle.  Dr. Swaziland reviewed the films and felt that she had noncardiac chest pain.  Post procedure, she is not having any chest pain or shortness of breath.  Her cath site remains stable.  Once her bedrest is completed, she will be discharged home in improved condition, to follow up as an outpatient.  DISCHARGE INSTRUCTIONS:  Her activity level is  to be increased gradually with no driving for 2 days and no lifting for a week with her right arm. She is to call our office for problems with the cath site.  She is encouraged to stick to a low-sodium heart-healthy diet.  She is to follow up with Norma Fredrickson, nurse practitioner, with Dr. Swaziland on June 17, 2011, at 11 a.m.  She is to follow up Dr. Ermalene Searing as needed.  DISCHARGE MEDICATIONS: 1. Alprazolam 0.5 mg one-half to one tab daily p.r.n. 2. Estradiol 2 mg q.a.m. 3. Estradiol vaginal tablet three times a week. 4. Aspirin 81 mg a day. 5. Sinus allergy OTC is discontinued. 6. Claritin 10 mg daily p.r.n.     Theodore Demark, PA-C   ______________________________ Peter M. Swaziland, M.D.    RB/MEDQ  D:  06/04/2011  T:  06/04/2011  Job:  [id number]  cc:   Kerby Nora, MD  Electronically Signed by Theodore Demark PA-C on 06/22/2011 06:45:13 AM Electronically Signed by PETER Swaziland M.D. on 06/23/2011 01:01:58 PM

## 2011-08-19 ENCOUNTER — Telehealth: Payer: Self-pay | Admitting: Cardiology

## 2011-08-19 NOTE — Telephone Encounter (Signed)
Pt is having a lot of trouble with her sinuses and she can't take decongestants anymore so she has been taking zeritec and it is not helping and she wants to take claraceton

## 2011-08-19 NOTE — Telephone Encounter (Signed)
Called stating she is having a lot of problems with her sinuses. Wants to know if can take Coricidin or Claritin. Advised per Lawson Fiscal that she can take either the Coricidin HPB or Claritin but not anything with decongestant.

## 2012-01-15 ENCOUNTER — Other Ambulatory Visit: Payer: Self-pay | Admitting: Obstetrics & Gynecology

## 2012-01-15 DIAGNOSIS — Z1231 Encounter for screening mammogram for malignant neoplasm of breast: Secondary | ICD-10-CM

## 2012-02-18 ENCOUNTER — Ambulatory Visit: Payer: BC Managed Care – PPO

## 2012-02-19 ENCOUNTER — Ambulatory Visit
Admission: RE | Admit: 2012-02-19 | Discharge: 2012-02-19 | Disposition: A | Discharge status: Home / Self Care | Payer: BC Managed Care – PPO | Source: Ambulatory Visit | Attending: Obstetrics & Gynecology | Admitting: Obstetrics & Gynecology

## 2012-02-19 ENCOUNTER — Ambulatory Visit: Payer: BC Managed Care – PPO

## 2012-02-19 DIAGNOSIS — Z1231 Encounter for screening mammogram for malignant neoplasm of breast: Secondary | ICD-10-CM

## 2012-03-11 ENCOUNTER — Other Ambulatory Visit: Payer: Self-pay | Admitting: Obstetrics & Gynecology

## 2012-03-11 DIAGNOSIS — R928 Other abnormal and inconclusive findings on diagnostic imaging of breast: Secondary | ICD-10-CM

## 2012-03-22 ENCOUNTER — Ambulatory Visit
Admission: RE | Admit: 2012-03-22 | Discharge: 2012-03-22 | Disposition: A | Discharge status: Home / Self Care | Payer: BC Managed Care – PPO | Source: Ambulatory Visit | Attending: Obstetrics & Gynecology | Admitting: Obstetrics & Gynecology

## 2012-03-22 DIAGNOSIS — R928 Other abnormal and inconclusive findings on diagnostic imaging of breast: Secondary | ICD-10-CM

## 2012-12-12 ENCOUNTER — Other Ambulatory Visit: Payer: Self-pay

## 2012-12-12 DIAGNOSIS — Z1231 Encounter for screening mammogram for malignant neoplasm of breast: Secondary | ICD-10-CM

## 2013-03-06 ENCOUNTER — Ambulatory Visit: Payer: BC Managed Care – PPO

## 2013-04-03 ENCOUNTER — Ambulatory Visit
Admission: RE | Admit: 2013-04-03 | Discharge: 2013-04-03 | Disposition: A | Discharge status: Home / Self Care | Payer: BC Managed Care – PPO | Source: Ambulatory Visit

## 2013-04-03 DIAGNOSIS — Z1231 Encounter for screening mammogram for malignant neoplasm of breast: Secondary | ICD-10-CM

## 2013-04-05 ENCOUNTER — Other Ambulatory Visit: Payer: Self-pay | Admitting: Obstetrics & Gynecology

## 2013-04-05 DIAGNOSIS — R928 Other abnormal and inconclusive findings on diagnostic imaging of breast: Secondary | ICD-10-CM

## 2013-04-19 ENCOUNTER — Ambulatory Visit
Admission: RE | Admit: 2013-04-19 | Discharge: 2013-04-19 | Disposition: A | Discharge status: Home / Self Care | Payer: BC Managed Care – PPO | Source: Ambulatory Visit | Attending: Obstetrics & Gynecology | Admitting: Obstetrics & Gynecology

## 2013-04-19 DIAGNOSIS — R928 Other abnormal and inconclusive findings on diagnostic imaging of breast: Secondary | ICD-10-CM

## 2013-12-13 ENCOUNTER — Other Ambulatory Visit: Payer: Self-pay

## 2013-12-13 DIAGNOSIS — Z1231 Encounter for screening mammogram for malignant neoplasm of breast: Secondary | ICD-10-CM

## 2014-04-04 ENCOUNTER — Encounter (INDEPENDENT_AMBULATORY_CARE_PROVIDER_SITE_OTHER): Payer: Self-pay

## 2014-04-04 ENCOUNTER — Ambulatory Visit
Admission: RE | Admit: 2014-04-04 | Discharge: 2014-04-04 | Disposition: A | Discharge status: Home / Self Care | Payer: BC Managed Care – PPO | Source: Ambulatory Visit

## 2014-04-04 DIAGNOSIS — Z1231 Encounter for screening mammogram for malignant neoplasm of breast: Secondary | ICD-10-CM

## 2015-01-16 ENCOUNTER — Other Ambulatory Visit: Payer: Self-pay

## 2015-01-16 DIAGNOSIS — Z1231 Encounter for screening mammogram for malignant neoplasm of breast: Secondary | ICD-10-CM

## 2015-04-08 ENCOUNTER — Ambulatory Visit
Admission: RE | Admit: 2015-04-08 | Discharge: 2015-04-08 | Disposition: A | Discharge status: Home / Self Care | Payer: BLUE CROSS/BLUE SHIELD | Source: Ambulatory Visit

## 2015-04-08 DIAGNOSIS — Z1231 Encounter for screening mammogram for malignant neoplasm of breast: Secondary | ICD-10-CM

## 2015-04-22 ENCOUNTER — Other Ambulatory Visit: Payer: Self-pay | Admitting: Obstetrics & Gynecology

## 2015-04-23 LAB — CYTOLOGY - PAP

## 2016-03-24 ENCOUNTER — Other Ambulatory Visit: Payer: Self-pay | Admitting: Obstetrics & Gynecology

## 2016-03-24 DIAGNOSIS — Z1231 Encounter for screening mammogram for malignant neoplasm of breast: Secondary | ICD-10-CM

## 2016-04-14 ENCOUNTER — Ambulatory Visit
Admission: RE | Admit: 2016-04-14 | Discharge: 2016-04-14 | Disposition: A | Discharge status: Home / Self Care | Payer: BLUE CROSS/BLUE SHIELD | Source: Ambulatory Visit | Attending: Obstetrics & Gynecology | Admitting: Obstetrics & Gynecology

## 2016-04-14 DIAGNOSIS — Z1231 Encounter for screening mammogram for malignant neoplasm of breast: Secondary | ICD-10-CM

## 2017-01-27 ENCOUNTER — Other Ambulatory Visit: Payer: Self-pay | Admitting: Obstetrics & Gynecology

## 2017-01-27 DIAGNOSIS — Z1231 Encounter for screening mammogram for malignant neoplasm of breast: Secondary | ICD-10-CM

## 2017-04-28 ENCOUNTER — Ambulatory Visit
Admission: RE | Admit: 2017-04-28 | Discharge: 2017-04-28 | Disposition: A | Discharge status: Home / Self Care | Payer: BLUE CROSS/BLUE SHIELD | Source: Ambulatory Visit | Attending: Obstetrics & Gynecology | Admitting: Obstetrics & Gynecology

## 2017-04-28 DIAGNOSIS — Z1231 Encounter for screening mammogram for malignant neoplasm of breast: Secondary | ICD-10-CM

## 2018-04-05 ENCOUNTER — Other Ambulatory Visit: Payer: Self-pay | Admitting: Obstetrics & Gynecology

## 2018-04-05 DIAGNOSIS — Z1231 Encounter for screening mammogram for malignant neoplasm of breast: Secondary | ICD-10-CM

## 2018-05-02 ENCOUNTER — Ambulatory Visit: Payer: BLUE CROSS/BLUE SHIELD

## 2018-05-02 ENCOUNTER — Ambulatory Visit
Admission: RE | Admit: 2018-05-02 | Discharge: 2018-05-02 | Disposition: A | Discharge status: Home / Self Care | Payer: BLUE CROSS/BLUE SHIELD | Source: Ambulatory Visit | Attending: Obstetrics & Gynecology | Admitting: Obstetrics & Gynecology

## 2018-05-02 DIAGNOSIS — Z1231 Encounter for screening mammogram for malignant neoplasm of breast: Secondary | ICD-10-CM

## 2018-11-21 ENCOUNTER — Other Ambulatory Visit: Payer: Self-pay

## 2018-11-21 ENCOUNTER — Ambulatory Visit: Payer: BLUE CROSS/BLUE SHIELD | Admitting: Anesthesiology

## 2018-11-21 ENCOUNTER — Encounter: Payer: Self-pay | Admitting: Student

## 2018-11-21 ENCOUNTER — Encounter
Admission: RE | Disposition: A | Discharge status: Home / Self Care | Payer: Self-pay | Source: Home / Self Care | Attending: Gastroenterology

## 2018-11-21 ENCOUNTER — Ambulatory Visit
Admission: RE | Admit: 2018-11-21 | Discharge: 2018-11-21 | Disposition: A | Discharge status: Home / Self Care | Payer: BLUE CROSS/BLUE SHIELD | Attending: Gastroenterology | Admitting: Gastroenterology

## 2018-11-21 DIAGNOSIS — Z7951 Long term (current) use of inhaled steroids: Secondary | ICD-10-CM | POA: Diagnosis not present

## 2018-11-21 DIAGNOSIS — J45909 Unspecified asthma, uncomplicated: Secondary | ICD-10-CM | POA: Insufficient documentation

## 2018-11-21 DIAGNOSIS — D123 Benign neoplasm of transverse colon: Secondary | ICD-10-CM | POA: Diagnosis not present

## 2018-11-21 DIAGNOSIS — K621 Rectal polyp: Secondary | ICD-10-CM | POA: Insufficient documentation

## 2018-11-21 DIAGNOSIS — Z1211 Encounter for screening for malignant neoplasm of colon: Secondary | ICD-10-CM | POA: Diagnosis not present

## 2018-11-21 DIAGNOSIS — Z79899 Other long term (current) drug therapy: Secondary | ICD-10-CM | POA: Diagnosis not present

## 2018-11-21 DIAGNOSIS — K5730 Diverticulosis of large intestine without perforation or abscess without bleeding: Secondary | ICD-10-CM | POA: Insufficient documentation

## 2018-11-21 DIAGNOSIS — Z7982 Long term (current) use of aspirin: Secondary | ICD-10-CM | POA: Insufficient documentation

## 2018-11-21 DIAGNOSIS — E559 Vitamin D deficiency, unspecified: Secondary | ICD-10-CM | POA: Diagnosis not present

## 2018-11-21 DIAGNOSIS — D120 Benign neoplasm of cecum: Secondary | ICD-10-CM | POA: Insufficient documentation

## 2018-11-21 DIAGNOSIS — Z800 Family history of malignant neoplasm of digestive organs: Secondary | ICD-10-CM | POA: Insufficient documentation

## 2018-11-21 DIAGNOSIS — K640 First degree hemorrhoids: Secondary | ICD-10-CM | POA: Insufficient documentation

## 2018-11-21 DIAGNOSIS — Z791 Long term (current) use of non-steroidal anti-inflammatories (NSAID): Secondary | ICD-10-CM | POA: Insufficient documentation

## 2018-11-21 HISTORY — DX: Overactive bladder: N32.81

## 2018-11-21 HISTORY — DX: Vitamin D deficiency, unspecified: E55.9

## 2018-11-21 HISTORY — PX: COLONOSCOPY WITH PROPOFOL: SHX5780

## 2018-11-21 SURGERY — COLONOSCOPY WITH PROPOFOL
Anesthesia: General

## 2018-11-21 MED ORDER — ONDANSETRON HCL 4 MG/2ML IJ SOLN
INTRAMUSCULAR | Status: AC
  Filled 2018-11-21: qty 2

## 2018-11-21 MED ORDER — PROPOFOL 500 MG/50ML IV EMUL
INTRAVENOUS | Status: DC | PRN
  Administered 2018-11-21: 120 ug/kg/min via INTRAVENOUS

## 2018-11-21 MED ORDER — PROPOFOL 10 MG/ML IV BOLUS
INTRAVENOUS | Status: DC | PRN
  Administered 2018-11-21: 100 mg via INTRAVENOUS
  Administered 2018-11-21: 30 mg via INTRAVENOUS

## 2018-11-21 MED ORDER — PROPOFOL 10 MG/ML IV BOLUS
INTRAVENOUS | Status: AC
  Filled 2018-11-21: qty 20

## 2018-11-21 MED ORDER — PROPOFOL 500 MG/50ML IV EMUL
INTRAVENOUS | Status: AC
  Filled 2018-11-21: qty 50

## 2018-11-21 MED ORDER — SODIUM CHLORIDE 0.9 % IV SOLN
INTRAVENOUS | Status: DC
  Administered 2018-11-21: 11:00:00 via INTRAVENOUS

## 2018-11-21 MED ORDER — ONDANSETRON HCL 4 MG/2ML IJ SOLN
4.0000 mg | Freq: Once | INTRAMUSCULAR | Status: AC | PRN
  Administered 2018-11-21: 4 mg via INTRAVENOUS

## 2018-11-21 MED ORDER — ONDANSETRON HCL 4 MG/2ML IJ SOLN: 4.0000 mg | Freq: Once | INTRAMUSCULAR | Status: DC

## 2018-11-21 NOTE — Anesthesia Procedure Notes (Signed)
Performed by: Gentry Fitz, CRNA Pre-anesthesia Checklist: Patient identified, Emergency Drugs available, Suction available, Patient being monitored and Timeout performed Oxygen Delivery Method: Nasal cannula

## 2018-11-21 NOTE — Anesthesia Postprocedure Evaluation (Signed)
Anesthesia Post Note  Patient: Theresa Stokes  Procedure(s) Performed: COLONOSCOPY WITH PROPOFOL (N/A )  Patient location during evaluation: Endoscopy Anesthesia Type: General Level of consciousness: awake and alert and oriented Pain management: pain level controlled Vital Signs Assessment: post-procedure vital signs reviewed and stable Respiratory status: spontaneous breathing Cardiovascular status: blood pressure returned to baseline Anesthetic complications: no Comments: Oral airway and lubricated small nasal trumpet placed for good airway control in a patient with a Mallampati 4 airway.  Noted a small amount of nasal bleeding controlled with afrin.     Last Vitals:  Vitals:   11/21/18 1317 11/21/18 1327  BP: 108/64 136/72  Pulse: 100 92  Resp: (!) 21 17  Temp:    SpO2: 100% 99%    Last Pain:  Vitals:   11/21/18 1327  TempSrc:   PainSc: 0-No pain                 Breely Panik

## 2018-11-21 NOTE — Op Note (Signed)
[facility] Gastroenterology Patient Name: Seila Liston Procedure Date: 11/21/2018 11:24 AM MRN: [id number] Account #: [id number] Date of Birth: 1955-10-10 Admit Type: Outpatient Age: 63 Room: [facility] ENDO ROOM 3 Gender: Female Note Status: Finalized Procedure:            Colonoscopy Indications:          Family history of colon cancer in a first-degree                        relative Providers:            Lollie Sails, MD Referring MD:         Leonie Douglas. Doy Hutching, MD (Referring MD) Medicines:            Monitored Anesthesia Care Complications:        No immediate complications. Procedure:            Pre-Anesthesia Assessment:                       - ASA Grade Assessment: II - A patient with mild                        systemic disease.                       After obtaining informed consent, the colonoscope was                        passed under direct vision. Throughout the procedure,                        the patient's blood pressure, pulse, and oxygen                        saturations were monitored continuously. The                        Colonoscope was introduced through the anus and                        advanced to the the cecum, identified by appendiceal                        orifice and ileocecal valve. The colonoscopy was                        unusually difficult due to a redundant colon,                        significant looping and a tortuous colon. Successful                        completion of the procedure was aided by changing the                        patient to a supine position, changing the patient to a                        prone position, using manual pressure and withdrawing  and reinserting the scope. The quality of the bowel                        preparation was good. Findings:      A 5 mm polyp was found in the transverse colon. The polyp was sessile.       The polyp was removed with a cold snare.  Resection and retrieval were       complete.      A 28 mm polyp was found in the cecum, on the cecal fold across from the       Ileo-cecal valve.. The polyp was sessile. Polypectomy was attempted,       initially using a lift and cut technique with a cold snare. Polyp       resection was incomplete with this device. This intervention then       required a different device and polypectomy technique. The polyp was       removed with a cold biopsy forceps. Resection and retrieval were       complete.      A 2 mm polyp was found in the rectum. The polyp was sessile. The polyp       was removed with a cold biopsy forceps. Resection and retrieval were       complete.      Many small and medium-mouthed diverticula were found in the sigmoid       colon, descending colon, transverse colon, ascending colon and cecum.      The exam was otherwise without abnormality.      Non-bleeding internal hemorrhoids were found during anoscopy. The       hemorrhoids were small and Grade I (internal hemorrhoids that do not       prolapse).      The digital rectal exam was normal otherwise, noot decreased rectal tone. Impression:           - One 5 mm polyp in the transverse colon, removed with                        a cold snare. Resected and retrieved.                       - One 28 mm polyp in the cecum, removed with a cold                        biopsy forceps. Resected and retrieved.                       - One 2 mm polyp in the rectum, removed with a cold                        biopsy forceps. Resected and retrieved.                       - Diverticulosis in the sigmoid colon, in the                        descending colon, in the transverse colon, in the                        ascending colon and in the cecum.                       -  The examination was otherwise normal.                       - Non-bleeding internal hemorrhoids. Recommendation:       - Discharge patient to home.                       -  Full liquid diet today.                       - Soft diet for 2 days, then advance as tolerated to                        advance diet as tolerated. Procedure Code(s):    --- Professional ---                       [phone], Colonoscopy, flexible; with removal of tumor(s),                        polyp(s), or other lesion(s) by snare technique                       45380, 31, Colonoscopy, flexible; with biopsy, single                        or multiple Diagnosis Code(s):    --- Professional ---                       D12.3, Benign neoplasm of transverse colon (hepatic                        flexure or splenic flexure)                       D12.0, Benign neoplasm of cecum                       K62.1, Rectal polyp                       K64.0, First degree hemorrhoids                       Z80.0, Family history of malignant neoplasm of                        digestive organs                       K57.30, Diverticulosis of large intestine without                        perforation or abscess without bleeding CPT copyright 2018 American Medical Association. All rights reserved. The codes documented in this report are preliminary and upon coder review may  be revised to meet current compliance requirements. Lollie Sails, MD 11/21/2018 1:07:07 PM This report has been signed electronically. Number of Addenda: 0 Note Initiated On: 11/21/2018 11:24 AM Scope Withdrawal Time: 0 hours 27 minutes 35 seconds  Total Procedure Duration: 1 hour 14 minutes 44 seconds       [facility]

## 2018-11-21 NOTE — H&P (Signed)
Outpatient short stay form Pre-procedure 11/21/2018 11:29 AM Lollie Sails MD  Primary Physician: Fulton Reek, MD  Reason for visit: Colon cancer screening, family history of colon cancer primary relative, mother.  History of present illness: Patient is a 63 year old female presenting today for her first colonoscopy.  There is a family history of colon cancer in primary relative, mother.  Tolerated her prep well.  She takes no aspirin or blood thinning agent with the exception of a 81 mg aspirin.    Current Facility-Administered Medications:  .  0.9 %  sodium chloride infusion, , Intravenous, Continuous, Lollie Sails, MD, Last Rate: 20 mL/hr at 11/21/18 1109 .  ondansetron (ZOFRAN) 4 MG/2ML injection, , , ,  .  ondansetron (ZOFRAN) injection 4 mg, 4 mg, Intravenous, Once, Alvin Critchley, MD  Medications Prior to Admission  Medication Sig Dispense Refill Last Dose  . alendronate (FOSAMAX) 10 MG tablet Take 10 mg by mouth daily before breakfast. Take with a full glass of water on an empty stomach.   Past Month at Unknown time  . ALPRAZolam (XANAX) 0.5 MG tablet Take 0.5 mg by mouth at bedtime as needed for anxiety.   Past Week at Unknown time  . aspirin 81 MG tablet Take 81 mg by mouth daily.     Past Month at Unknown time  . darifenacin (ENABLEX) 7.5 MG 24 hr tablet Take 7.5 mg by mouth daily.     . diclofenac sodium (VOLTAREN) 1 % GEL Apply 2 g topically 2 (two) times daily.     Marland Kitchen estradiol (ESTRACE) 2 MG tablet Take 2 mg by mouth daily.     11/20/2018 at Unknown time  . fluticasone (FLONASE) 50 MCG/ACT nasal spray Place into both nostrils daily.     . cetirizine (ZYRTEC) 10 MG tablet Take 10 mg by mouth daily.   Not Taking at Unknown time  . Cholecalciferol (VITAMIN D) 2000 UNITS tablet Take 2,000 Units by mouth daily.     Not Taking at Unknown time  . EPINEPHrine (EPI-PEN) 0.3 mg/0.3 mL DEVI Inject 0.3 mg into the muscle as needed.    Taking  . ibandronate (BONIVA) 150 MG  tablet Take 150 mg by mouth every 30 (thirty) days. Take in the morning with a full glass of water, on an empty stomach, and do not take anything else by mouth or lie down for the next 30 min.   Not Taking at Unknown time     Allergies  Allergen Reactions  . Clindamycin     REACTION: Rash, SOB  . Codeine     REACTION: N \\T \ V  . Erythromycin     REACTION: Rash, SOB  . Other     Bee stings, banana  . Penicillins     REACTION: As a child     Past Medical History:  Diagnosis Date  . Allergy   . Arm pain, left   . Asthma   . Chest pain    s/p cath; has normal coronaries and normal EF  . Fatigue   . Overactive bladder   . S/P cardiac cath Sept 2012   Normal  . SOB (shortness of breath)    occassionally  . Tingling    in fingers  . Vitamin D deficiency     Review of systems:      Physical Exam    Heart and lungs: Rhythm without rub or gallop, lungs are bilaterally clear.    HEENT: Normocephalic atraumatic eyes are anicteric  Other:    Pertinant exam for procedure: Soft nontender nondistended bowel sounds positive normoactive.    Planned proceedures: Colonoscopy I have discussed the risks benefits and complications of procedures to include not limited to bleeding, infection, perforation and the risk of sedation and the patient wishes to proceed. and indicated procedures.    Lollie Sails, MD Gastroenterology 11/21/2018  11:29 AM

## 2018-11-21 NOTE — Transfer of Care (Signed)
Immediate Anesthesia Transfer of Care Note  Patient: Theresa Stokes  Procedure(s) Performed: COLONOSCOPY WITH PROPOFOL (N/A )  Patient Location: PACU  Anesthesia Type:General  Level of Consciousness: awake and drowsy  Airway & Oxygen Therapy: Patient Spontanous Breathing and Patient connected to nasal cannula oxygen  Post-op Assessment: Report given to RN and Post -op Vital signs reviewed and stable  Post vital signs: Reviewed and stable  Last Vitals:  Vitals Value Taken Time  BP    Temp    Pulse 105 11/21/2018  1:08 PM  Resp    SpO2 100 % 11/21/2018  1:08 PM  Vitals shown include unvalidated device data.  Last Pain:  Vitals:   11/21/18 1307  TempSrc: Tympanic  PainSc: 0-No pain         Complications: No apparent anesthesia complications

## 2018-11-21 NOTE — Anesthesia Post-op Follow-up Note (Signed)
Anesthesia QCDR form completed.        

## 2018-11-21 NOTE — Anesthesia Preprocedure Evaluation (Addendum)
Anesthesia Evaluation  Patient identified by MRN, date of birth, ID band Patient awake    Reviewed: Allergy & Precautions, NPO status , Patient's Chart, lab work & pertinent test results  Airway Mallampati: IV  TM Distance: <3 FB     Dental  (+) Teeth Intact   Pulmonary shortness of breath and with exertion, asthma ,    Pulmonary exam normal        Cardiovascular Normal cardiovascular exam     Neuro/Psych negative neurological ROS  negative psych ROS   GI/Hepatic   Endo/Other    Renal/GU negative Renal ROS Bladder dysfunction      Musculoskeletal negative musculoskeletal ROS (+)   Abdominal Normal abdominal exam  (+)   Peds negative pediatric ROS (+)  Hematology negative hematology ROS (+)   Anesthesia Other Findings Past Medical History: No date: Allergy No date: Arm pain, left No date: Asthma No date: Chest pain     Comment:  s/p cath; has normal coronaries and normal EF No date: Fatigue No date: Overactive bladder Sept 2012: S/P cardiac cath     Comment:  Normal No date: SOB (shortness of breath)     Comment:  occassionally No date: Tingling     Comment:  in fingers No date: Vitamin D deficiency  Reproductive/Obstetrics                            Anesthesia Physical Anesthesia Plan  ASA: II  Anesthesia Plan: General   Post-op Pain Management:    Induction: Intravenous  PONV Risk Score and Plan: Propofol infusion  Airway Management Planned: Nasal Cannula  Additional Equipment:   Intra-op Plan:   Post-operative Plan:   Informed Consent: I have reviewed the patients History and Physical, chart, labs and discussed the procedure including the risks, benefits and alternatives for the proposed anesthesia with the patient or authorized representative who has indicated his/her understanding and acceptance.     Dental advisory given  Plan Discussed with: CRNA and  Surgeon  Anesthesia Plan Comments:         Anesthesia Quick Evaluation

## 2018-11-22 LAB — SURGICAL PATHOLOGY

## 2018-11-23 ENCOUNTER — Encounter: Payer: Self-pay | Admitting: Gastroenterology

## 2019-03-21 ENCOUNTER — Other Ambulatory Visit: Payer: Self-pay | Admitting: Obstetrics & Gynecology

## 2019-03-21 DIAGNOSIS — Z1231 Encounter for screening mammogram for malignant neoplasm of breast: Secondary | ICD-10-CM

## 2019-05-24 ENCOUNTER — Other Ambulatory Visit: Payer: Self-pay

## 2019-05-24 ENCOUNTER — Ambulatory Visit
Admission: RE | Admit: 2019-05-24 | Discharge: 2019-05-24 | Disposition: A | Discharge status: Home / Self Care | Payer: Managed Care, Other (non HMO) | Source: Ambulatory Visit | Attending: Obstetrics & Gynecology | Admitting: Obstetrics & Gynecology

## 2019-05-24 DIAGNOSIS — Z1231 Encounter for screening mammogram for malignant neoplasm of breast: Secondary | ICD-10-CM

## 2020-05-16 ENCOUNTER — Other Ambulatory Visit: Payer: Self-pay | Admitting: Obstetrics & Gynecology

## 2020-05-16 DIAGNOSIS — Z1231 Encounter for screening mammogram for malignant neoplasm of breast: Secondary | ICD-10-CM

## 2020-05-28 ENCOUNTER — Ambulatory Visit
Admission: RE | Admit: 2020-05-28 | Discharge: 2020-05-28 | Disposition: A | Discharge status: Home / Self Care | Payer: Managed Care, Other (non HMO) | Source: Ambulatory Visit | Attending: Obstetrics & Gynecology | Admitting: Obstetrics & Gynecology

## 2020-05-28 ENCOUNTER — Other Ambulatory Visit: Payer: Self-pay

## 2020-05-28 DIAGNOSIS — Z1231 Encounter for screening mammogram for malignant neoplasm of breast: Secondary | ICD-10-CM

## 2021-01-16 ENCOUNTER — Other Ambulatory Visit: Payer: 59

## 2021-01-20 ENCOUNTER — Ambulatory Visit: Payer: 59 | Admitting: Anesthesiology

## 2021-01-20 ENCOUNTER — Ambulatory Visit
Admission: RE | Admit: 2021-01-20 | Discharge: 2021-01-20 | Disposition: A | Discharge status: Home / Self Care | Payer: 59 | Attending: Gastroenterology | Admitting: Gastroenterology

## 2021-01-20 ENCOUNTER — Encounter: Payer: Self-pay | Admitting: Anesthesiology

## 2021-01-20 ENCOUNTER — Encounter
Admission: RE | Disposition: A | Discharge status: Home / Self Care | Payer: Self-pay | Source: Home / Self Care | Attending: Gastroenterology

## 2021-01-20 ENCOUNTER — Other Ambulatory Visit: Payer: Self-pay

## 2021-01-20 DIAGNOSIS — Z7982 Long term (current) use of aspirin: Secondary | ICD-10-CM | POA: Diagnosis not present

## 2021-01-20 DIAGNOSIS — Z79899 Other long term (current) drug therapy: Secondary | ICD-10-CM | POA: Insufficient documentation

## 2021-01-20 DIAGNOSIS — Z800 Family history of malignant neoplasm of digestive organs: Secondary | ICD-10-CM | POA: Insufficient documentation

## 2021-01-20 DIAGNOSIS — Z1211 Encounter for screening for malignant neoplasm of colon: Secondary | ICD-10-CM | POA: Insufficient documentation

## 2021-01-20 DIAGNOSIS — Z880 Allergy status to penicillin: Secondary | ICD-10-CM | POA: Insufficient documentation

## 2021-01-20 DIAGNOSIS — Z86010 Personal history of colonic polyps: Secondary | ICD-10-CM | POA: Insufficient documentation

## 2021-01-20 DIAGNOSIS — Z91030 Bee allergy status: Secondary | ICD-10-CM | POA: Insufficient documentation

## 2021-01-20 DIAGNOSIS — Z885 Allergy status to narcotic agent status: Secondary | ICD-10-CM | POA: Diagnosis not present

## 2021-01-20 DIAGNOSIS — Z9889 Other specified postprocedural states: Secondary | ICD-10-CM | POA: Insufficient documentation

## 2021-01-20 DIAGNOSIS — Z881 Allergy status to other antibiotic agents status: Secondary | ICD-10-CM | POA: Insufficient documentation

## 2021-01-20 DIAGNOSIS — Z91018 Allergy to other foods: Secondary | ICD-10-CM | POA: Insufficient documentation

## 2021-01-20 DIAGNOSIS — Z79890 Hormone replacement therapy (postmenopausal): Secondary | ICD-10-CM | POA: Diagnosis not present

## 2021-01-20 HISTORY — PX: COLONOSCOPY WITH PROPOFOL: SHX5780

## 2021-01-20 SURGERY — COLONOSCOPY WITH PROPOFOL
Anesthesia: General

## 2021-01-20 MED ORDER — PROPOFOL 500 MG/50ML IV EMUL
INTRAVENOUS | Status: AC
  Filled 2021-01-20: qty 50

## 2021-01-20 MED ORDER — PHENYLEPHRINE HCL (PRESSORS) 10 MG/ML IV SOLN
INTRAVENOUS | Status: DC | PRN
  Administered 2021-01-20: 50 ug via INTRAVENOUS

## 2021-01-20 MED ORDER — ONDANSETRON HCL 4 MG/2ML IJ SOLN
INTRAMUSCULAR | Status: DC | PRN
  Administered 2021-01-20: 4 mg via INTRAVENOUS

## 2021-01-20 MED ORDER — PHENYLEPHRINE HCL (PRESSORS) 10 MG/ML IV SOLN
INTRAVENOUS | Status: AC
  Filled 2021-01-20: qty 1

## 2021-01-20 MED ORDER — ONDANSETRON HCL 4 MG/2ML IJ SOLN
INTRAMUSCULAR | Status: AC
  Filled 2021-01-20: qty 2

## 2021-01-20 MED ORDER — PROPOFOL 500 MG/50ML IV EMUL
INTRAVENOUS | Status: DC | PRN
  Administered 2021-01-20: 150 ug/kg/min via INTRAVENOUS

## 2021-01-20 MED ORDER — SODIUM CHLORIDE 0.9 % IV SOLN: INTRAVENOUS | Status: DC

## 2021-01-20 NOTE — Transfer of Care (Signed)
Immediate Anesthesia Transfer of Care Note  Patient: Theresa Stokes  Procedure(s) Performed: COLONOSCOPY WITH PROPOFOL (N/A )  Patient Location: PACU  Anesthesia Type:General  Level of Consciousness: awake and alert   Airway & Oxygen Therapy: Patient Spontanous Breathing and Patient connected to nasal cannula oxygen  Post-op Assessment: Report given to RN and Post -op Vital signs reviewed and stable  Post vital signs: Reviewed and stable  Last Vitals:  Vitals Value Taken Time  BP    Temp    Pulse    Resp    SpO2      Last Pain:  Vitals:   01/20/21 1209  TempSrc: Tympanic  PainSc: 0-No pain         Complications: No complications documented.

## 2021-01-20 NOTE — Interval H&P Note (Signed)
History and Physical Interval Note:  01/20/2021 12:33 PM  Theresa Stokes  has presented today for surgery, with the diagnosis of history of aden polyp of colon.  The various methods of treatment have been discussed with the patient and family. After consideration of risks, benefits and other options for treatment, the patient has consented to  Procedure(s): COLONOSCOPY WITH PROPOFOL (N/A) as a surgical intervention.  The patient's history has been reviewed, patient examined, no change in status, stable for surgery.  I have reviewed the patient's chart and labs.  Questions were answered to the patient's satisfaction.     Lesly Rubenstein  Ok to proceed with colonoscopy

## 2021-01-20 NOTE — Anesthesia Preprocedure Evaluation (Signed)
Anesthesia Evaluation  Patient identified by MRN, date of birth, ID band Patient awake    Reviewed: Allergy & Precautions, NPO status , Patient's Chart, lab work & pertinent test results  History of Anesthesia Complications (+) PONV  Airway Mallampati: III  TM Distance: >3 FB Neck ROM: Full    Dental  (+) Teeth Intact   Pulmonary shortness of breath and with exertion, asthma ,    Pulmonary exam normal        Cardiovascular hypertension, Pt. on medications Normal cardiovascular exam     Neuro/Psych Anxiety negative neurological ROS  negative psych ROS   GI/Hepatic Bowel prep,  Endo/Other    Renal/GU negative Renal ROS Bladder dysfunction      Musculoskeletal negative musculoskeletal ROS (+)   Abdominal Normal abdominal exam  (+)   Peds negative pediatric ROS (+)  Hematology negative hematology ROS (+)   Anesthesia Other Findings Past Medical History: No date: Allergy No date: Arm pain, left No date: Asthma No date: Chest pain     Comment:  s/p cath; has normal coronaries and normal EF No date: Fatigue No date: Overactive bladder Sept 2012: S/P cardiac cath     Comment:  Normal No date: SOB (shortness of breath)     Comment:  occassionally No date: Tingling     Comment:  in fingers No date: Vitamin D deficiency  Reproductive/Obstetrics                             Anesthesia Physical  Anesthesia Plan  ASA: II  Anesthesia Plan: General   Post-op Pain Management:    Induction: Intravenous  PONV Risk Score and Plan: 3 and Propofol infusion and TIVA  Airway Management Planned: Nasal Cannula and Natural Airway  Additional Equipment:   Intra-op Plan:   Post-operative Plan:   Informed Consent: I have reviewed the patients History and Physical, chart, labs and discussed the procedure including the risks, benefits and alternatives for the proposed anesthesia with the  patient or authorized representative who has indicated his/her understanding and acceptance.     Dental advisory given  Plan Discussed with: CRNA, Surgeon and Anesthesiologist  Anesthesia Plan Comments:         Anesthesia Quick Evaluation

## 2021-01-20 NOTE — H&P (Signed)
Outpatient short stay form Pre-procedure 01/20/2021 12:29 PM Theresa Miyamoto MD, MPH  Primary Physician: Dr. Doy Hutching  Reason for visit:  Surveillance colonoscopy  History of present illness:    65 y/o lady with history of large polyp removed piecemeal here for colonoscopy. Polyp was TVA. Mother with colon cancer in her 12's. No blood thinners. History of hysterectomy. Per prior note, has tortuous colon. No new symptoms.    Current Facility-Administered Medications:  .  0.9 %  sodium chloride infusion, , Intravenous, Continuous, Dainelle Hun, Hilton Cork, MD, Last Rate: 20 mL/hr at 01/20/21 1225, New Bag at 01/20/21 1225  Medications Prior to Admission  Medication Sig Dispense Refill Last Dose  . ALPRAZolam (XANAX) 0.5 MG tablet Take 0.5 mg by mouth at bedtime as needed for anxiety.   Past Month at Unknown time  . aspirin 81 MG tablet Take 81 mg by mouth daily.   Past Month at Unknown time  . cetirizine (ZYRTEC) 10 MG tablet Take 10 mg by mouth daily.   01/19/2021 at Unknown time  . Cholecalciferol (VITAMIN D) 2000 UNITS tablet Take 2,000 Units by mouth daily.   01/19/2021 at Unknown time  . darifenacin (ENABLEX) 7.5 MG 24 hr tablet Take 7.5 mg by mouth daily.   01/19/2021 at Unknown time  . EPINEPHrine (EPI-PEN) 0.3 mg/0.3 mL DEVI Inject 0.3 mg into the muscle as needed.   Past Month at Unknown time  . estradiol (ESTRACE) 2 MG tablet Take 2 mg by mouth daily.   01/19/2021 at Unknown time  . fluticasone (FLONASE) 50 MCG/ACT nasal spray Place into both nostrils daily.   01/19/2021 at Unknown time  . losartan-hydrochlorothiazide (HYZAAR) 100-25 MG tablet Take 1 tablet by mouth daily.   01/20/2021 at 0730  . alendronate (FOSAMAX) 10 MG tablet Take 10 mg by mouth daily before breakfast. Take with a full glass of water on an empty stomach. (Patient not taking: Reported on 01/20/2021)   Not Taking at Unknown time  . diclofenac sodium (VOLTAREN) 1 % GEL Apply 2 g topically 2 (two) times daily. (Patient not taking:  Reported on 01/20/2021)   Not Taking at Unknown time  . ibandronate (BONIVA) 150 MG tablet Take 150 mg by mouth every 30 (thirty) days. Take in the morning with a full glass of water, on an empty stomach, and do not take anything else by mouth or lie down for the next 30 min. (Patient not taking: Reported on 01/20/2021)   Not Taking at Unknown time     Allergies  Allergen Reactions  . Clindamycin     REACTION: Rash, SOB  . Codeine     REACTION: N \\T \ V  . Erythromycin     REACTION: Rash, SOB  . Other     Bee stings, banana  . Penicillins     REACTION: As a child     Past Medical History:  Diagnosis Date  . Allergy   . Arm pain, left   . Asthma   . Chest pain    s/p cath; has normal coronaries and normal EF  . Fatigue   . Overactive bladder   . S/P cardiac cath Sept 2012   Normal  . SOB (shortness of breath)    occassionally  . Tingling    in fingers  . Vitamin D deficiency     Review of systems:  Otherwise negative.    Physical Exam  Gen: Alert, oriented. Appears stated age.  HEENT: PERRLA. Lungs: No respiratory distress CV: RRR Abd:  soft, benign, no masses Ext: No edema    Planned procedures: Proceed with colonoscopy. The patient understands the nature of the planned procedure, indications, risks, alternatives and potential complications including but not limited to bleeding, infection, perforation, damage to internal organs and possible oversedation/side effects from anesthesia. The patient agrees and gives consent to proceed.  Please refer to procedure notes for findings, recommendations and patient disposition/instructions.     Theresa Miyamoto MD, MPH Gastroenterology 01/20/2021  12:29 PM

## 2021-01-20 NOTE — Anesthesia Postprocedure Evaluation (Signed)
Anesthesia Post Note  Patient: Theresa Stokes  Procedure(s) Performed: COLONOSCOPY WITH PROPOFOL (N/A )  Patient location during evaluation: Phase II Anesthesia Type: General Level of consciousness: awake and alert, awake and oriented Pain management: pain level controlled Vital Signs Assessment: post-procedure vital signs reviewed and stable Respiratory status: spontaneous breathing, nonlabored ventilation and respiratory function stable Cardiovascular status: blood pressure returned to baseline and stable Postop Assessment: no apparent nausea or vomiting Anesthetic complications: no   No complications documented.   Last Vitals:  Vitals:   01/20/21 1327 01/20/21 1337  BP: 118/66 130/67  Pulse: 82 71  Resp: (!) 21 18  Temp:    SpO2: 100% 100%    Last Pain:  Vitals:   01/20/21 1337  TempSrc:   PainSc: 0-No pain                 Phill Mutter

## 2021-01-20 NOTE — Anesthesia Procedure Notes (Signed)
Performed by: Cook-Martin, Damichael Hofman Pre-anesthesia Checklist: Patient identified, Emergency Drugs available, Suction available, Patient being monitored and Timeout performed Patient Re-evaluated:Patient Re-evaluated prior to induction Oxygen Delivery Method: Nasal cannula Preoxygenation: Pre-oxygenation with 100% oxygen Induction Type: IV induction Placement Confirmation: positive ETCO2 and CO2 detector       

## 2021-01-20 NOTE — Op Note (Signed)
[facility] Patient Name: Theresa Stokes Procedure Date: 01/20/2021 12:34 PM MRN: [id number] Account #: [id number] Date of Birth: December 03, 1955 Admit Type: Outpatient Age: 65 Room: [facility] ENDO ROOM 3 Gender: Female Note Status: Finalized Procedure:             Colonoscopy Indications:           High risk colon cancer surveillance: Personal history                         of adenoma with villous component Providers:             Andrey Farmer MD, MD Referring MD:          Leonie Douglas. Doy Hutching, MD (Referring MD) Medicines:             Monitored Anesthesia Care Complications:         No immediate complications. Procedure:             Pre-Anesthesia Assessment:                        - Prior to the procedure, a History and Physical was                         performed, and patient medications and allergies were                         reviewed. The patient is competent. The risks and                         benefits of the procedure and the sedation options and                         risks were discussed with the patient. All questions                         were answered and informed consent was obtained.                         Patient identification and proposed procedure were                         verified by the physician, the nurse, the anesthetist                         and the technician in the endoscopy suite. Mental                         Status Examination: alert and oriented. Airway                         Examination: normal oropharyngeal airway and neck                         mobility. Respiratory Examination: clear to                         auscultation. CV Examination: normal. Prophylactic  Antibiotics: The patient does not require prophylactic                         antibiotics. Prior Anticoagulants: The patient has                         taken no previous anticoagulant or antiplatelet                          agents. ASA Grade Assessment: II - A patient with mild                         systemic disease. After reviewing the risks and                         benefits, the patient was deemed in satisfactory                         condition to undergo the procedure. The anesthesia                         plan was to use monitored anesthesia care (MAC).                         Immediately prior to administration of medications,                         the patient was re-assessed for adequacy to receive                         sedatives. The heart rate, respiratory rate, oxygen                         saturations, blood pressure, adequacy of pulmonary                         ventilation, and response to care were monitored                         throughout the procedure. The physical status of the                         patient was re-assessed after the procedure.                        After obtaining informed consent, the colonoscope was                         passed under direct vision. Throughout the procedure,                         the patient's blood pressure, pulse, and oxygen                         saturations were monitored continuously. The                         Colonoscope was introduced through the anus and  advanced to the the cecum, identified by the ileocecal                         valve. The colonoscopy was somewhat difficult due to a                         redundant colon. Successful completion of the                         procedure was aided by applying abdominal pressure.                         The patient tolerated the procedure well. The quality                         of the bowel preparation was poor. Findings:      The perianal and digital rectal examinations were normal.      Extensive amounts of semi-liquid stool was found in the entire colon,       making visualization difficult. Lavage of the area was performed using a       large  amount, resulting in incomplete clearance with continued poor       visualization.      A post polypectomy scar was found in the proximal ascending colon. The       scar tissue was healthy in appearance. Biopsies were taken with a cold       forceps for histology. Estimated blood loss was minimal.      The exam was otherwise without abnormality on direct and retroflexion       views. Impression:            - Preparation of the colon was poor.                        - Stool in the entire examined colon.                        - Post-polypectomy scar in the proximal ascending                         colon. Biopsied.                        - The examination was otherwise normal on direct and                         retroflexion views. Recommendation:        - Discharge patient to home.                        - Resume previous diet.                        - Continue present medications.                        - Repeat colonoscopy at the next available appointment                         because the bowel preparation was suboptimal.                        -  Return to referring physician as previously                         scheduled. Procedure Code(s):     --- Professional ---                        [id number], Colonoscopy, flexible; with biopsy, single or                         multiple Diagnosis Code(s):     --- Professional ---                        Z86.010, Personal history of colonic polyps                        Z98.890, Other specified postprocedural states CPT copyright 2019 [employer]. All rights reserved. The codes documented in this report are preliminary and upon coder review may  be revised to meet current compliance requirements. Andrey Farmer MD, MD 01/20/2021 1:10:49 PM Number of Addenda: 0 Note Initiated On: 01/20/2021 12:34 PM Scope Withdrawal Time: 0 hours 5 minutes 28 seconds  Total Procedure Duration: 0 hours 22 minutes 58 seconds  Estimated Blood  Loss:  Estimated blood loss: none.      [facility]

## 2021-01-21 ENCOUNTER — Encounter: Payer: Self-pay | Admitting: Gastroenterology

## 2021-01-21 LAB — SURGICAL PATHOLOGY

## 2021-06-02 ENCOUNTER — Ambulatory Visit: Admit: 2021-06-02 | Payer: 59

## 2021-06-02 SURGERY — COLONOSCOPY WITH PROPOFOL
Anesthesia: General

## 2021-07-21 ENCOUNTER — Other Ambulatory Visit: Payer: Self-pay | Admitting: Obstetrics & Gynecology

## 2021-07-21 DIAGNOSIS — Z1231 Encounter for screening mammogram for malignant neoplasm of breast: Secondary | ICD-10-CM

## 2021-07-25 ENCOUNTER — Ambulatory Visit
Admission: RE | Admit: 2021-07-25 | Discharge: 2021-07-25 | Disposition: A | Discharge status: Home / Self Care | Payer: 59 | Source: Ambulatory Visit | Attending: Obstetrics & Gynecology | Admitting: Obstetrics & Gynecology

## 2021-07-25 DIAGNOSIS — Z1231 Encounter for screening mammogram for malignant neoplasm of breast: Secondary | ICD-10-CM

## 2021-12-31 IMAGING — MG MM DIGITAL SCREENING BILAT W/ TOMO AND CAD
8 series · 8 of 24 positions shown · non-contrast
Comparison: Previous exam(s).

CLINICAL DATA: Screening.

EXAM:
DIGITAL SCREENING BILATERAL MAMMOGRAM WITH TOMOSYNTHESIS AND CAD
TECHNIQUE: Bilateral screening digital craniocaudal and mediolateral oblique
mammograms were obtained. Bilateral screening digital breast
tomosynthesis was performed. The images were evaluated with
computer-aided detection.

[R CC synth-2D]
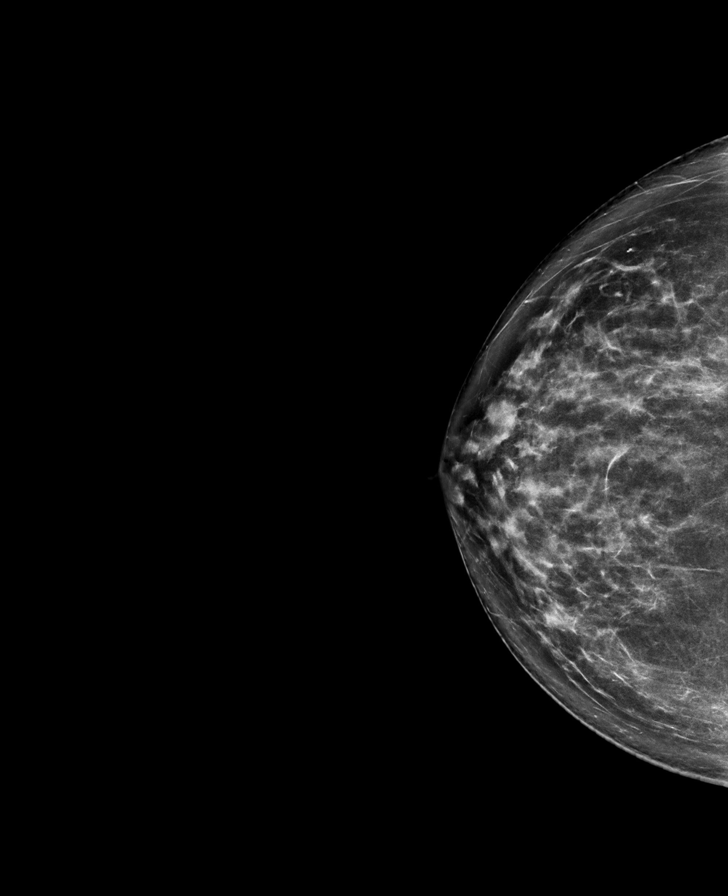

[R MLO synth-2D]
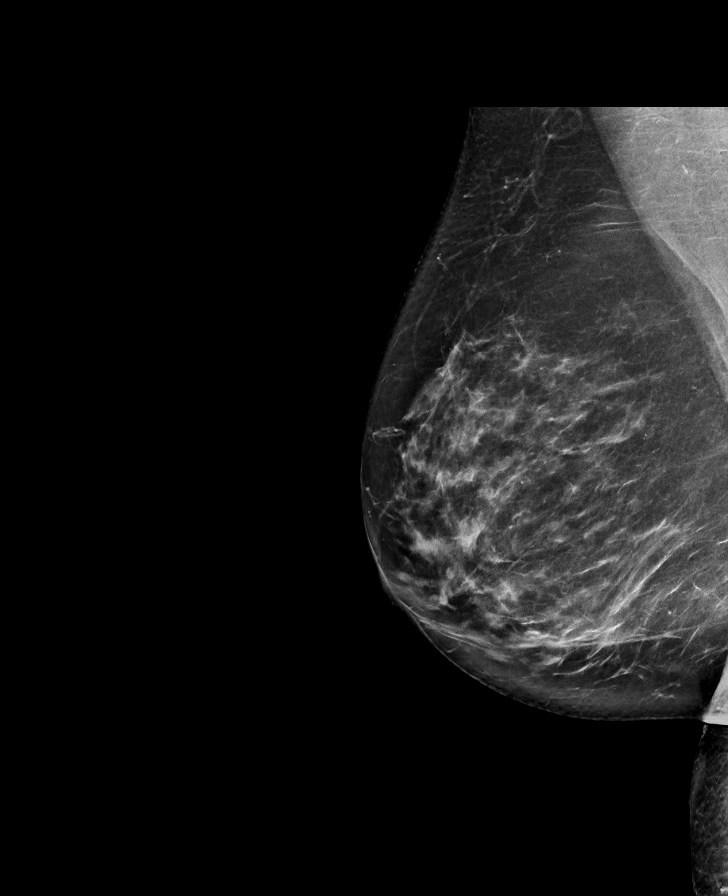

[L CC synth-2D]
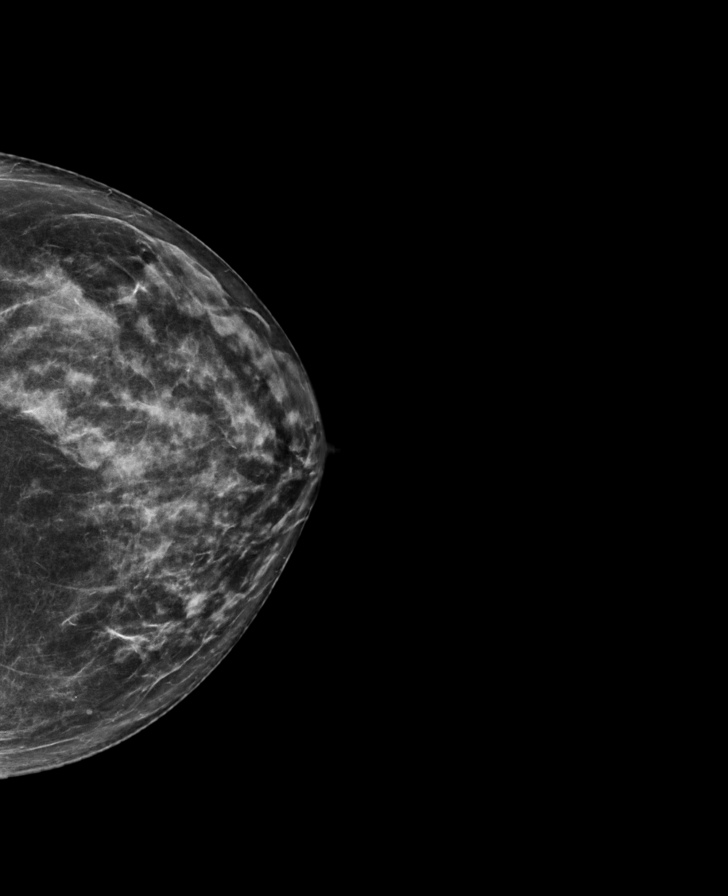

[L MLO synth-2D]
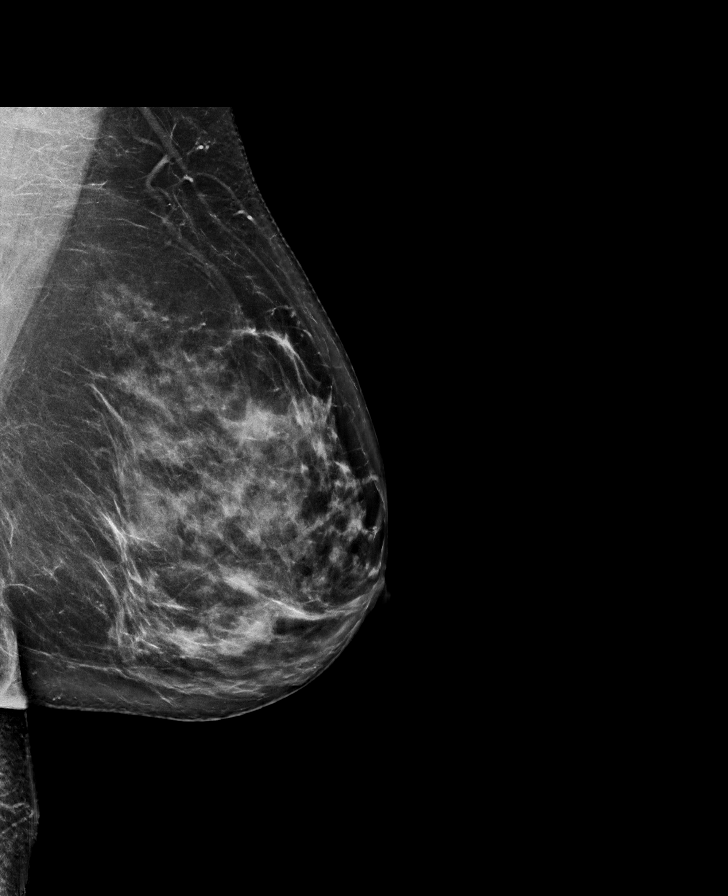

[R MLO tomo · tomo slice 46/91.0]
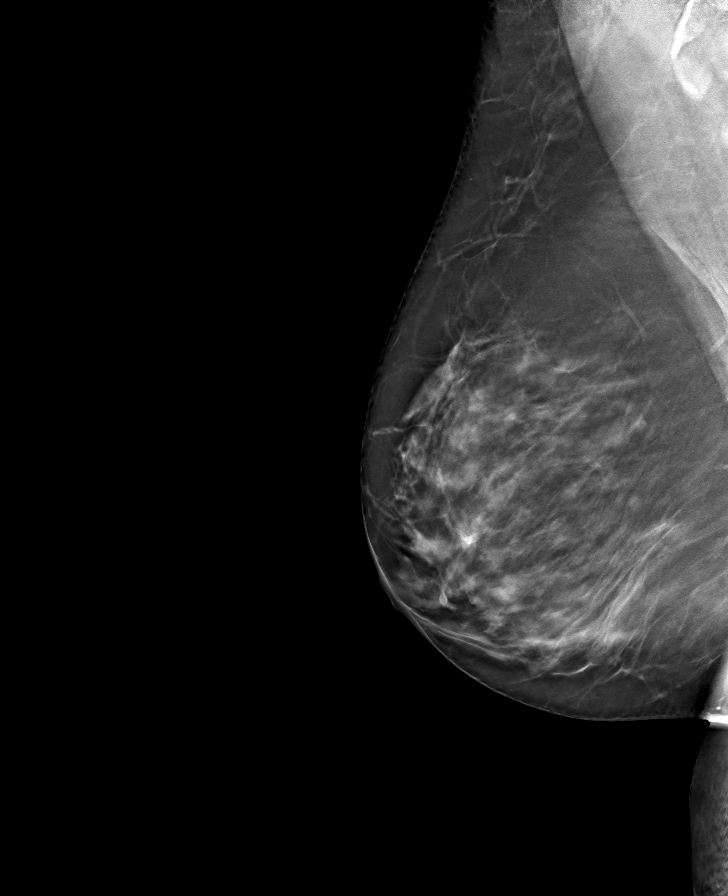

[L CC tomo · tomo slice 37/74.0]
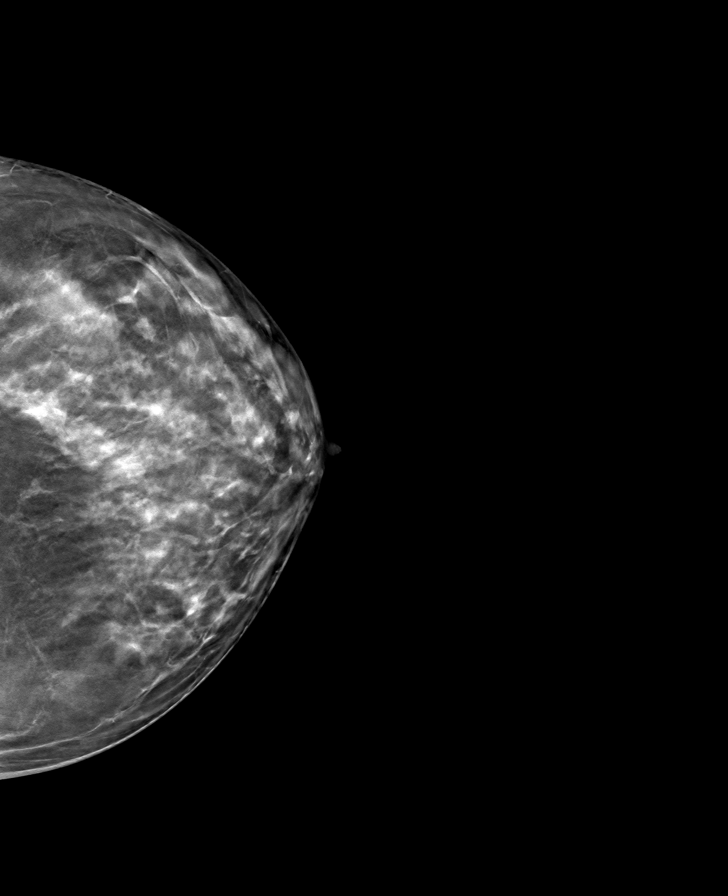

[L MLO tomo · tomo slice 43/84.0]
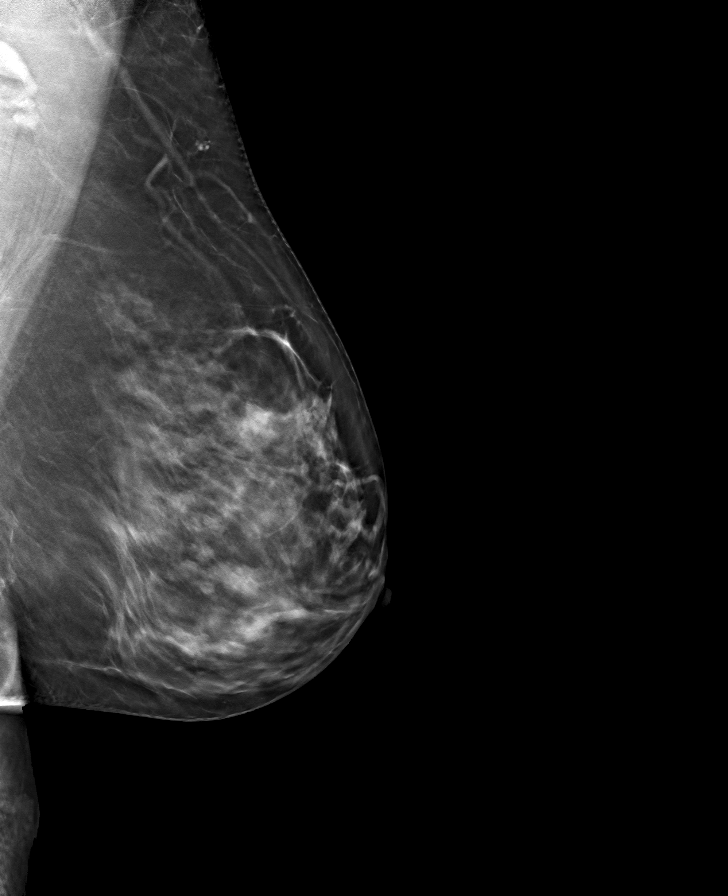

[R CC tomo · tomo slice 41/80.0]
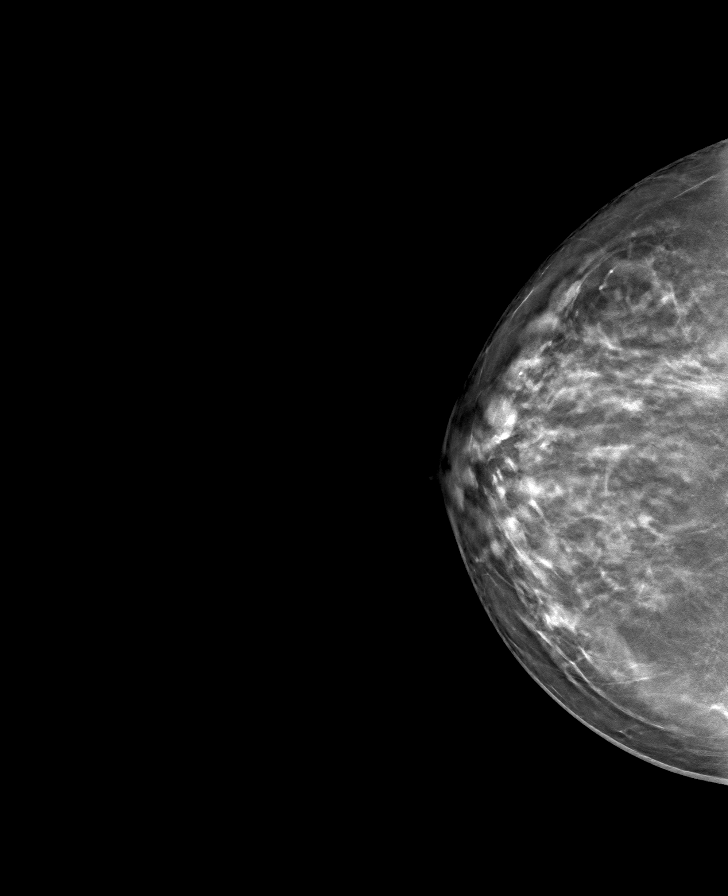

[8 of 24 positions shown; findings below may reference images not displayed]

ACR Breast Density Category c: The breast tissue is heterogeneously
dense, which may obscure small masses.
FINDINGS: There are no findings suspicious for malignancy.
IMPRESSION: No mammographic evidence of malignancy. A result letter of this
screening mammogram will be mailed directly to the patient.

RECOMMENDATION:
Screening mammogram in one year. (Code:Q3-W-BC3)

BI-RADS CATEGORY  1: Negative.

## 2022-07-14 ENCOUNTER — Other Ambulatory Visit: Payer: Self-pay | Admitting: Obstetrics & Gynecology

## 2022-07-14 DIAGNOSIS — Z1231 Encounter for screening mammogram for malignant neoplasm of breast: Secondary | ICD-10-CM

## 2022-09-04 ENCOUNTER — Ambulatory Visit
Admission: RE | Admit: 2022-09-04 | Discharge: 2022-09-04 | Disposition: A | Discharge status: Home / Self Care | Payer: Medicare Other | Source: Ambulatory Visit | Attending: Obstetrics & Gynecology | Admitting: Obstetrics & Gynecology

## 2022-09-04 DIAGNOSIS — Z1231 Encounter for screening mammogram for malignant neoplasm of breast: Secondary | ICD-10-CM

## 2023-08-02 ENCOUNTER — Other Ambulatory Visit: Payer: Self-pay | Admitting: Internal Medicine

## 2023-08-02 DIAGNOSIS — Z1231 Encounter for screening mammogram for malignant neoplasm of breast: Secondary | ICD-10-CM
